# Patient Record
Sex: Female | Born: 1970 | Race: White | Hispanic: No | Smoking: Never smoker
Health system: Southern US, Community
[De-identification: ages and names within clinical notes are randomized; demographics above are authoritative.]

## PROBLEM LIST (undated history)

## (undated) DIAGNOSIS — Z87442 Personal history of urinary calculi: Secondary | ICD-10-CM

## (undated) DIAGNOSIS — Z9889 Other specified postprocedural states: Secondary | ICD-10-CM

## (undated) DIAGNOSIS — Z8489 Family history of other specified conditions: Secondary | ICD-10-CM

## (undated) DIAGNOSIS — M169 Osteoarthritis of hip, unspecified: Secondary | ICD-10-CM

## (undated) DIAGNOSIS — Z85828 Personal history of other malignant neoplasm of skin: Secondary | ICD-10-CM

## (undated) DIAGNOSIS — B019 Varicella without complication: Secondary | ICD-10-CM

## (undated) DIAGNOSIS — M25552 Pain in left hip: Secondary | ICD-10-CM

## (undated) DIAGNOSIS — R001 Bradycardia, unspecified: Secondary | ICD-10-CM

## (undated) DIAGNOSIS — E785 Hyperlipidemia, unspecified: Secondary | ICD-10-CM

## (undated) DIAGNOSIS — Q2112 Patent foramen ovale: Secondary | ICD-10-CM

## (undated) DIAGNOSIS — R030 Elevated blood-pressure reading, without diagnosis of hypertension: Secondary | ICD-10-CM

## (undated) DIAGNOSIS — G4700 Insomnia, unspecified: Secondary | ICD-10-CM

## (undated) DIAGNOSIS — Q211 Atrial septal defect: Secondary | ICD-10-CM

## (undated) HISTORY — DX: Pain in left hip: M25.552

## (undated) HISTORY — DX: Atrial septal defect: Q21.1

## (undated) HISTORY — DX: Hyperlipidemia, unspecified: E78.5

## (undated) HISTORY — DX: Other specified postprocedural states: Z98.890

## (undated) HISTORY — DX: Varicella without complication: B01.9

## (undated) HISTORY — DX: Bradycardia, unspecified: R00.1

## (undated) HISTORY — DX: Insomnia, unspecified: G47.00

## (undated) HISTORY — DX: Osteoarthritis of hip, unspecified: M16.9

## (undated) HISTORY — DX: Patent foramen ovale: Q21.12

---

## 1995-08-09 HISTORY — PX: OTHER SURGICAL HISTORY: SHX169

## 1998-04-21 ENCOUNTER — Ambulatory Visit (HOSPITAL_COMMUNITY): Admission: RE | Admit: 1998-04-21 | Discharge: 1998-04-21 | Payer: Self-pay | Admitting: Obstetrics & Gynecology

## 1998-05-15 ENCOUNTER — Inpatient Hospital Stay (HOSPITAL_COMMUNITY): Admission: AD | Admit: 1998-05-15 | Discharge: 1998-05-15 | Payer: Self-pay | Admitting: *Deleted

## 1998-06-29 ENCOUNTER — Inpatient Hospital Stay (HOSPITAL_COMMUNITY): Admission: AD | Admit: 1998-06-29 | Discharge: 1998-06-29 | Payer: Self-pay | Admitting: Obstetrics & Gynecology

## 1998-06-29 ENCOUNTER — Encounter: Payer: Self-pay | Admitting: Obstetrics & Gynecology

## 1998-07-17 ENCOUNTER — Inpatient Hospital Stay (HOSPITAL_COMMUNITY): Admission: RE | Admit: 1998-07-17 | Discharge: 1998-07-17 | Payer: Self-pay | Admitting: *Deleted

## 1998-07-20 ENCOUNTER — Inpatient Hospital Stay (HOSPITAL_COMMUNITY): Admission: AD | Admit: 1998-07-20 | Discharge: 1998-07-22 | Payer: Self-pay | Admitting: Obstetrics & Gynecology

## 1998-07-29 ENCOUNTER — Observation Stay (HOSPITAL_COMMUNITY): Admission: AD | Admit: 1998-07-29 | Discharge: 1998-07-30 | Payer: Self-pay | Admitting: Obstetrics

## 1998-07-29 ENCOUNTER — Encounter (HOSPITAL_COMMUNITY): Admission: RE | Admit: 1998-07-29 | Discharge: 1998-09-03 | Payer: Self-pay | Admitting: Obstetrics & Gynecology

## 1998-07-29 ENCOUNTER — Encounter: Admission: RE | Admit: 1998-07-29 | Discharge: 1998-07-29 | Payer: Self-pay | Admitting: Obstetrics & Gynecology

## 1998-08-02 ENCOUNTER — Inpatient Hospital Stay (HOSPITAL_COMMUNITY): Admission: AD | Admit: 1998-08-02 | Discharge: 1998-08-04 | Payer: Self-pay | Admitting: Obstetrics

## 1998-08-05 ENCOUNTER — Encounter (HOSPITAL_COMMUNITY): Admission: RE | Admit: 1998-08-05 | Discharge: 1998-09-02 | Payer: Self-pay | Admitting: Obstetrics

## 1998-08-05 ENCOUNTER — Encounter: Admission: RE | Admit: 1998-08-05 | Discharge: 1998-08-05 | Payer: Self-pay | Admitting: Obstetrics & Gynecology

## 1998-08-13 ENCOUNTER — Encounter: Admission: RE | Admit: 1998-08-13 | Discharge: 1998-08-13 | Payer: Self-pay | Admitting: Obstetrics

## 1998-08-20 ENCOUNTER — Encounter: Admission: RE | Admit: 1998-08-20 | Discharge: 1998-08-20 | Payer: Self-pay | Admitting: Obstetrics

## 1998-08-31 ENCOUNTER — Inpatient Hospital Stay (HOSPITAL_COMMUNITY): Admission: AD | Admit: 1998-08-31 | Discharge: 1998-09-05 | Payer: Self-pay | Admitting: Obstetrics

## 1998-09-03 ENCOUNTER — Encounter: Payer: Self-pay | Admitting: Obstetrics

## 1998-09-09 ENCOUNTER — Inpatient Hospital Stay (HOSPITAL_COMMUNITY): Admission: AD | Admit: 1998-09-09 | Discharge: 1998-09-09 | Payer: Self-pay | Admitting: *Deleted

## 1998-09-09 ENCOUNTER — Encounter (HOSPITAL_COMMUNITY): Admission: RE | Admit: 1998-09-09 | Discharge: 1998-12-08 | Payer: Self-pay | Admitting: *Deleted

## 1999-05-24 ENCOUNTER — Emergency Department (HOSPITAL_COMMUNITY): Admission: EM | Admit: 1999-05-24 | Discharge: 1999-05-24 | Payer: Self-pay | Admitting: Internal Medicine

## 1999-07-06 ENCOUNTER — Emergency Department (HOSPITAL_COMMUNITY): Admission: EM | Admit: 1999-07-06 | Discharge: 1999-07-06 | Payer: Self-pay | Admitting: Emergency Medicine

## 1999-10-01 ENCOUNTER — Other Ambulatory Visit: Admission: RE | Admit: 1999-10-01 | Discharge: 1999-10-01 | Payer: Self-pay | Admitting: Obstetrics and Gynecology

## 2000-02-20 ENCOUNTER — Encounter: Payer: Self-pay | Admitting: Emergency Medicine

## 2000-02-20 ENCOUNTER — Emergency Department (HOSPITAL_COMMUNITY): Admission: EM | Admit: 2000-02-20 | Discharge: 2000-02-20 | Payer: Self-pay | Admitting: Emergency Medicine

## 2000-09-22 ENCOUNTER — Other Ambulatory Visit: Admission: RE | Admit: 2000-09-22 | Discharge: 2000-09-22 | Payer: Self-pay | Admitting: Obstetrics and Gynecology

## 2001-09-25 ENCOUNTER — Other Ambulatory Visit: Admission: RE | Admit: 2001-09-25 | Discharge: 2001-09-25 | Payer: Self-pay | Admitting: *Deleted

## 2001-10-06 HISTORY — PX: OTHER SURGICAL HISTORY: SHX169

## 2002-09-26 ENCOUNTER — Other Ambulatory Visit: Admission: RE | Admit: 2002-09-26 | Discharge: 2002-09-26 | Payer: Self-pay | Admitting: Obstetrics and Gynecology

## 2002-11-01 ENCOUNTER — Ambulatory Visit (HOSPITAL_COMMUNITY): Admission: RE | Admit: 2002-11-01 | Discharge: 2002-11-01 | Payer: Self-pay | Admitting: Obstetrics and Gynecology

## 2003-01-24 ENCOUNTER — Encounter: Payer: Self-pay | Admitting: Obstetrics and Gynecology

## 2003-01-24 ENCOUNTER — Encounter: Admission: RE | Admit: 2003-01-24 | Discharge: 2003-01-24 | Payer: Self-pay | Admitting: Obstetrics and Gynecology

## 2003-06-28 ENCOUNTER — Emergency Department (HOSPITAL_COMMUNITY): Admission: EM | Admit: 2003-06-28 | Discharge: 2003-06-28 | Payer: Self-pay | Admitting: Emergency Medicine

## 2003-08-09 HISTORY — PX: BUNIONECTOMY: SHX129

## 2005-07-03 ENCOUNTER — Emergency Department (HOSPITAL_COMMUNITY): Admission: EM | Admit: 2005-07-03 | Discharge: 2005-07-03 | Payer: Self-pay | Admitting: Emergency Medicine

## 2005-08-17 ENCOUNTER — Emergency Department (HOSPITAL_COMMUNITY): Admission: EM | Admit: 2005-08-17 | Discharge: 2005-08-18 | Payer: Self-pay | Admitting: Emergency Medicine

## 2005-08-23 ENCOUNTER — Ambulatory Visit: Payer: Self-pay | Admitting: Internal Medicine

## 2005-08-24 ENCOUNTER — Encounter (INDEPENDENT_AMBULATORY_CARE_PROVIDER_SITE_OTHER): Payer: Self-pay | Admitting: Specialist

## 2005-08-24 ENCOUNTER — Ambulatory Visit: Payer: Self-pay | Admitting: Internal Medicine

## 2005-08-31 ENCOUNTER — Ambulatory Visit (HOSPITAL_COMMUNITY): Admission: RE | Admit: 2005-08-31 | Discharge: 2005-08-31 | Payer: Self-pay | Admitting: Internal Medicine

## 2006-02-03 ENCOUNTER — Ambulatory Visit (HOSPITAL_COMMUNITY): Admission: RE | Admit: 2006-02-03 | Discharge: 2006-02-03 | Payer: Self-pay | Admitting: Obstetrics & Gynecology

## 2006-09-08 ENCOUNTER — Emergency Department (HOSPITAL_COMMUNITY): Admission: EM | Admit: 2006-09-08 | Discharge: 2006-09-08 | Payer: Self-pay | Admitting: Emergency Medicine

## 2009-02-02 ENCOUNTER — Emergency Department (HOSPITAL_BASED_OUTPATIENT_CLINIC_OR_DEPARTMENT_OTHER): Admission: EM | Admit: 2009-02-02 | Discharge: 2009-02-03 | Payer: Self-pay | Admitting: Emergency Medicine

## 2010-06-08 HISTORY — PX: ENDOMETRIAL ABLATION: SHX621

## 2010-06-08 LAB — HM PAP SMEAR

## 2010-06-25 ENCOUNTER — Ambulatory Visit (HOSPITAL_COMMUNITY): Admission: RE | Admit: 2010-06-25 | Discharge: 2010-06-25 | Payer: Self-pay | Admitting: Obstetrics

## 2010-10-19 LAB — CBC
MCHC: 33.8 g/dL (ref 30.0–36.0)
MCV: 86.7 fL (ref 78.0–100.0)
Platelets: 410 10*3/uL — ABNORMAL HIGH (ref 150–400)
RDW: 14.2 % (ref 11.5–15.5)
WBC: 8.3 10*3/uL (ref 4.0–10.5)

## 2010-12-24 NOTE — Op Note (Signed)
NAME:  Hannah Davis, Hannah Davis                          ACCOUNT NO.:  [id number]   MEDICAL RECORD NO.:  [id number]                   PATIENT TYPE:  AMB   LOCATION:  SDC                                  FACILITY:  [facility]   PHYSICIAN:  Cynthia P. Romine, M.D.             DATE OF BIRTH:  [birth year]   DATE OF PROCEDURE:  11/01/2002  DATE OF DISCHARGE:                                 OPERATIVE REPORT   PREOPERATIVE DIAGNOSIS:  Desire for attempt at permanent sterilization.   POSTOPERATIVE DIAGNOSIS:  Desire for attempt at permanent sterilization   PROCEDURE:  Falope ring laparoscopic tubal sterilization.   SURGEON:  Cynthia P. Romine, M.D.   ANESTHESIA:  General endotracheal.   ESTIMATED BLOOD LOSS:  Minimal.   COMPLICATIONS:  None.   DESCRIPTION OF PROCEDURE:  The patient was taken to the operating room and  after induction of adequate general anesthesia was placed in the dorsal  lithotomy position and prepped and draped in the usual fashion.  The bladder  was drained in and out catheter.  The cervix was visualized and grasped on  the anterior lip with Davis single-tooth tenaculum.  An acorn uterine  manipulator was placed.  Davis small umbilical incision was made, and the Veress  needle was inserted into the peritoneal space.  Proper placement of the  Veress needle was tested by noting free flow of the Veress needle with  negative aspirate and then by noting the response with Davis drop of saline  placed at the hub of the Veress needle with negative pressure as the  abdominal wall was elevated.  The pneumoperitoneum was created with 2.5  liters of CO2 using the automatic insufflator.  Davis disposable 10-11 mm trocar  was then introduced into the peritoneal space and its proper placement noted  with the laparoscope.  Next, Davis small suprapubic incision was made, and the  lower trocar was inserted under direct visualization.  The left fallopian  tube was identified and traced to its fimbriated end.  The mid  isthmic  portion was elevated, and the Falope ring was placed without difficulty.  The knuckle of tube was noted to be contained within the ring, and good  blanching was noted.  There was no bleeding.  The procedure was identified  on the patient's right, identifying the tube and tracing it to its  fimbriated end.  The mid isthmic portion was elevated, and Falope ring was  placed.  Davis good knuckle of tube was noted to be contained within the ring.  Good blanching was noted, and no bleeding was encountered.  The remainder of  the pelvis appeared normal.  The uterus was of Davis normal size, shape and  contour.  The anterior and posterior cul-de-sacs were normal.  The ovaries  and tubes appeared normal.  The laparoscope and the Falope ring applier were  then removed, and the pneumoperitoneum was allowed to escape.  The  laparoscopic sleeves were removed.  The incisions were closed subcuticularly  with 3-0 Vicryl.  Instruments were removed from the vagina, and the  procedure was terminated.  The patient tolerated it well and went in  satisfactory condition to post anesthesia recovery.                                               Cynthia P. Romine, M.D.    CPR/MEDQ  D:  11/01/2002  T:  11/02/2002  Job:  [id number]

## 2011-06-01 ENCOUNTER — Other Ambulatory Visit: Payer: Self-pay | Admitting: Family Medicine

## 2011-06-01 ENCOUNTER — Encounter: Payer: Self-pay | Admitting: Family Medicine

## 2011-06-01 ENCOUNTER — Ambulatory Visit (INDEPENDENT_AMBULATORY_CARE_PROVIDER_SITE_OTHER): Payer: BC Managed Care – PPO | Admitting: Family Medicine

## 2011-06-01 DIAGNOSIS — Z9889 Other specified postprocedural states: Secondary | ICD-10-CM | POA: Insufficient documentation

## 2011-06-01 DIAGNOSIS — Q211 Atrial septal defect: Secondary | ICD-10-CM | POA: Insufficient documentation

## 2011-06-01 DIAGNOSIS — Z8742 Personal history of other diseases of the female genital tract: Secondary | ICD-10-CM

## 2011-06-01 DIAGNOSIS — R609 Edema, unspecified: Secondary | ICD-10-CM

## 2011-06-01 DIAGNOSIS — G4700 Insomnia, unspecified: Secondary | ICD-10-CM

## 2011-06-01 DIAGNOSIS — Z0000 Encounter for general adult medical examination without abnormal findings: Secondary | ICD-10-CM

## 2011-06-01 DIAGNOSIS — E669 Obesity, unspecified: Secondary | ICD-10-CM

## 2011-06-01 DIAGNOSIS — Z8669 Personal history of other diseases of the nervous system and sense organs: Secondary | ICD-10-CM

## 2011-06-01 DIAGNOSIS — B019 Varicella without complication: Secondary | ICD-10-CM | POA: Insufficient documentation

## 2011-06-01 DIAGNOSIS — E785 Hyperlipidemia, unspecified: Secondary | ICD-10-CM

## 2011-06-01 DIAGNOSIS — E079 Disorder of thyroid, unspecified: Secondary | ICD-10-CM | POA: Insufficient documentation

## 2011-06-01 DIAGNOSIS — M25552 Pain in left hip: Secondary | ICD-10-CM

## 2011-06-01 MED ORDER — FUROSEMIDE 20 MG PO TABS: 20.0000 mg | ORAL_TABLET | Freq: Every day | ORAL | Status: DC

## 2011-06-01 NOTE — Patient Instructions (Signed)

## 2011-06-02 LAB — BASIC METABOLIC PANEL
BUN: 12 mg/dL (ref 6–23)
CO2: 28 mEq/L (ref 19–32)
Creat: 0.83 mg/dL (ref 0.50–1.10)
Sodium: 139 mEq/L (ref 135–145)

## 2011-06-02 LAB — LIPID PANEL
Cholesterol: 194 mg/dL (ref 0–200)
LDL Cholesterol: 122 mg/dL — ABNORMAL HIGH (ref 0–99)
Total CHOL/HDL Ratio: 4.7 Ratio
Triglycerides: 154 mg/dL — ABNORMAL HIGH (ref <150)

## 2011-06-02 LAB — CBC
HCT: 41.6 % (ref 36.0–46.0)
MCH: 29.4 pg (ref 26.0–34.0)
MCHC: 32.7 g/dL (ref 30.0–36.0)
Platelets: 397 10*3/uL (ref 150–400)
RBC: 4.62 MIL/uL (ref 3.87–5.11)
RDW: 13.6 % (ref 11.5–15.5)

## 2011-06-02 LAB — HEPATIC FUNCTION PANEL
Albumin: 3.7 g/dL (ref 3.5–5.2)
Bilirubin, Direct: 0.1 mg/dL (ref 0.0–0.3)
Indirect Bilirubin: 0.2 mg/dL (ref 0.0–0.9)
Total Protein: 6.9 g/dL (ref 6.0–8.3)

## 2011-06-02 LAB — PHOSPHORUS: Phosphorus: 3.8 mg/dL (ref 2.3–4.6)

## 2011-06-05 ENCOUNTER — Encounter: Payer: Self-pay | Admitting: Family Medicine

## 2011-06-05 DIAGNOSIS — M25552 Pain in left hip: Secondary | ICD-10-CM

## 2011-06-05 DIAGNOSIS — Z0000 Encounter for general adult medical examination without abnormal findings: Secondary | ICD-10-CM | POA: Insufficient documentation

## 2011-06-05 DIAGNOSIS — E785 Hyperlipidemia, unspecified: Secondary | ICD-10-CM

## 2011-06-05 DIAGNOSIS — IMO0001 Reserved for inherently not codable concepts without codable children: Secondary | ICD-10-CM | POA: Insufficient documentation

## 2011-06-05 DIAGNOSIS — Z8742 Personal history of other diseases of the female genital tract: Secondary | ICD-10-CM | POA: Insufficient documentation

## 2011-06-05 DIAGNOSIS — G4700 Insomnia, unspecified: Secondary | ICD-10-CM

## 2011-06-05 HISTORY — DX: Insomnia, unspecified: G47.00

## 2011-06-05 HISTORY — DX: Hyperlipidemia, unspecified: E78.5

## 2011-06-05 HISTORY — DX: Pain in left hip: M25.552

## 2011-06-05 NOTE — Assessment & Plan Note (Signed)
Mild elevation of LDL cholesterol, avoid trans fats, add a fish oil caps and fiber supplement

## 2011-06-05 NOTE — Assessment & Plan Note (Addendum)
Falls asleep readily but wakes after a couple hours and has trouble falling back to sleep. She denies any snoring or am HA. Discussed good sleep hygiene. May consider use of Melatonin and/or Benadryl

## 2011-06-05 NOTE — Assessment & Plan Note (Signed)
Discussed need for heart healthy diet, increased exercise, 8 hours of sleep and seat belt use

## 2011-06-05 NOTE — Assessment & Plan Note (Signed)
No recent flares 

## 2011-06-05 NOTE — Assessment & Plan Note (Addendum)
Asymptomatic, encouraged DASH diet and increased exercise, recheck bp at the next visit

## 2011-06-05 NOTE — Assessment & Plan Note (Signed)
Encouraged increased  Exercise and decreased po intake avoid trans fats and try DASH diet

## 2011-06-05 NOTE — Progress Notes (Signed)
Hannah Davis [id number] 1971-04-15 06/05/2011      Progress Note-Follow Up  Subjective  Chief Complaint  Chief Complaint  Patient presents with  . Establish Care    new patient/ wellness visit for [employer]    HPI  Patient is a 40 year old female in today for new patient appointment. Her major ongoing complaint is weight gain. She reports over the last 5 years she has gained 145 pounds. She has seen pediatric clinics and had multiple medications and diet. 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 She walks and exercises regularly. She tries to minimize her by mouth intake and avoids red meat. She finds herself very frustrated. No recent febrile illness, fevers, chills, chest pain, palpitations, shortness of breath, GI or GU concerns. She has a history of migraines but has not had any recent travel. She does have her with sleep. She falls asleep but wakes after one to 2 hours and has trouble falling back to sleep. She denies snoring or a.m. headaches. Her major complaint today is left hip pain she describes as sharp and intermittent. No radicular symptoms or incontinence is noted. She underwent endometrial ablation in November 2011 secondary to menorrhagia has done well since that time. Had a tetanus shot and a Pap at that time. Declines flu shots.  Past Medical History  Diagnosis Date  . Chicken pox as a child  . Obesity   . Thyroid disorder   . PFO (patent foramen ovale)   . Hx of migraines   . Elevated BP 06/05/2011  . Hyperlipidemia 06/05/2011    Past Surgical History  Procedure Date  . Endometrial ablation 11-11  . Tubes tied 3-03  . Arthroscopy on right wrist 1997  . Bunionectomy 2005    left foot big toe  . Cesarean section 1-00    Family History  Problem Relation Age of Onset  . Cancer Mother 34    kidney/ removed kidney in remission  . Fibromyalgia Mother   . Asthma Mother   . Stroke Mother   . Heart attack Mother   . Arthritis Mother     rheumatoid  . Diabetes Father       type 2  . Heart disease Father   . Other Father     open heart bypass surgery/ smoked  . Other Sister     kidney problems  . Heart disease Brother   . Other Brother     open heart surgery  . Drug abuse Brother     cocaine  . Other Maternal Grandmother     brain tumor  . Heart attack Maternal Grandfather   . Heart disease Maternal Grandfather     MI  . Stroke Paternal Grandmother 41  . Other Paternal Grandfather     enlarged heart  . Obesity Paternal Grandfather   . Stroke Brother     X 2    History   Social History  . Marital Status: Married    Spouse Name: N/A    Number of Children: N/A  . Years of Education: N/A   Occupational History  . Not on file.   Social History Main Topics  . Smoking status: Never Smoker   . Smokeless tobacco: Never Used  . Alcohol Use: Yes     a beer on the weekend  . Drug Use: No  . Sexually Active: Yes -- Female partner(s)   Other Topics Concern  . Not on file   Social History Narrative  . No narrative on file  No current outpatient prescriptions on file prior to visit.    Allergies  Allergen Reactions  . Codeine Hives  . Ibuprofen Nausea And Vomiting    Review of Systems  Review of Systems  Constitutional: Negative for fever, chills, weight loss and malaise/fatigue.       Weight gain of 145# in 5 years, despite walking and trying to minimize calories and avoiding red meat  HENT: Negative for hearing loss, nosebleeds and congestion.   Eyes: Negative for discharge.  Respiratory: Negative for cough, sputum production, shortness of breath and wheezing.   Cardiovascular: Negative for chest pain, palpitations and leg swelling.  Gastrointestinal: Negative for heartburn, nausea, vomiting, abdominal pain, diarrhea, constipation and blood in stool.  Genitourinary: Negative for dysuria, urgency, frequency and hematuria.  Musculoskeletal: Positive for joint pain. Negative for myalgias, back pain and falls.       Hip pain,  left  Skin: Negative for rash.  Neurological: Positive for headaches. Negative for dizziness, tremors, sensory change, focal weakness, loss of consciousness and weakness.  Endo/Heme/Allergies: Negative for polydipsia. Does not bruise/bleed easily.  Psychiatric/Behavioral: Negative for depression and suicidal ideas. The patient has insomnia. The patient is not nervous/anxious.     Objective  BP 134/95  Pulse 74  Temp(Src) 97.9 F (36.6 C) (Oral)  Ht 5' 5.5" (1.664 m)  Wt 300 lb 6.4 oz (136.261 kg)  BMI 49.23 kg/m2  SpO2 99%  Physical Exam  Physical Exam  Constitutional: She is oriented to person, place, and time and well-developed, well-nourished, and in no distress. No distress.       obese  HENT:  Head: Normocephalic and atraumatic.  Right Ear: External ear normal.  Left Ear: External ear normal.  Nose: Nose normal.  Mouth/Throat: Oropharynx is clear and moist. No oropharyngeal exudate.  Eyes: Conjunctivae are normal. Pupils are equal, round, and reactive to light. Right eye exhibits no discharge. Left eye exhibits no discharge. No scleral icterus.  Neck: Normal range of motion. Neck supple. No thyromegaly present.  Cardiovascular: Normal rate, regular rhythm, normal heart sounds and intact distal pulses.   No murmur heard. Pulmonary/Chest: Effort normal and breath sounds normal. No respiratory distress. She has no wheezes. She has no rales.  Abdominal: Soft. Bowel sounds are normal. She exhibits no distension and no mass. There is no tenderness.  Musculoskeletal: Normal range of motion. She exhibits no edema and no tenderness.  Lymphadenopathy:    She has no cervical adenopathy.  Neurological: She is alert and oriented to person, place, and time. She has normal reflexes. No cranial nerve deficit. Coordination normal.  Skin: Skin is warm and dry. No rash noted. She is not diaphoretic. No erythema.  Psychiatric: Mood, memory, affect and judgment normal.    Lab Results   Component Value Date   TSH 3.231 06/01/2011   Lab Results  Component Value Date   WBC 7.6 06/01/2011   HGB 13.6 06/01/2011   HCT 41.6 06/01/2011   MCV 90.0 06/01/2011   PLT 397 06/01/2011   Lab Results  Component Value Date   CREATININE 0.83 06/01/2011   BUN 12 06/01/2011   NA 139 06/01/2011   K 4.2 06/01/2011   CL 106 06/01/2011   CO2 28 06/01/2011   Lab Results  Component Value Date   ALT 10 06/01/2011   AST 19 06/01/2011   ALKPHOS 79 06/01/2011   BILITOT 0.3 06/01/2011   Lab Results  Component Value Date   CHOL 194 06/01/2011   Lab Results  Component Value Date   HDL 41 06/01/2011   Lab Results  Component Value Date   LDLCALC 122* 06/01/2011   Lab Results  Component Value Date   TRIG 154* 06/01/2011   Lab Results  Component Value Date   CHOLHDL 4.7 06/01/2011     Assessment & Plan   Elevated BP Asymptomatic, encouraged DASH diet and increased exercise, recheck bp at the next visit  Obesity Encouraged increased  Exercise and decreased po intake avoid trans fats and try DASH diet  Thyroid disorder Thyroid numbers well wnl today will monitor with patient  Hyperlipidemia Mild elevation of LDL cholesterol, avoid trans fats, add a fish oil caps and fiber supplement  Hx of migraines No recent flares  Insomnia Falls asleep readily but wakes after a couple hours and has trouble falling back to sleep. She denies any snoring or am HA. Discussed good sleep hygiene. May consider use of Melatonin and/or Benadryl  Preventative health care Discussed need for heart healthy diet, increased exercise, 8 hours of sleep and seat belt use

## 2011-06-05 NOTE — Assessment & Plan Note (Signed)
Thyroid numbers well wnl today will monitor with patient

## 2012-05-08 HISTORY — PX: LAPAROSCOPIC GASTRIC SLEEVE RESECTION: SHX5895

## 2012-05-16 ENCOUNTER — Encounter: Payer: Self-pay | Admitting: Family Medicine

## 2012-05-16 ENCOUNTER — Ambulatory Visit (INDEPENDENT_AMBULATORY_CARE_PROVIDER_SITE_OTHER): Payer: PRIVATE HEALTH INSURANCE | Admitting: Family Medicine

## 2012-05-16 VITALS — BP 135/97 | HR 97 | Temp 97.2°F | Ht 65.5 in | Wt 299.4 lb

## 2012-05-16 DIAGNOSIS — R030 Elevated blood-pressure reading, without diagnosis of hypertension: Secondary | ICD-10-CM

## 2012-05-16 DIAGNOSIS — E669 Obesity, unspecified: Secondary | ICD-10-CM

## 2012-05-16 DIAGNOSIS — E079 Disorder of thyroid, unspecified: Secondary | ICD-10-CM

## 2012-05-16 DIAGNOSIS — Z0000 Encounter for general adult medical examination without abnormal findings: Secondary | ICD-10-CM

## 2012-05-16 DIAGNOSIS — Z8669 Personal history of other diseases of the nervous system and sense organs: Secondary | ICD-10-CM

## 2012-05-16 DIAGNOSIS — E785 Hyperlipidemia, unspecified: Secondary | ICD-10-CM

## 2012-05-16 LAB — LIPID PANEL
LDL Cholesterol: 128 mg/dL — ABNORMAL HIGH (ref 0–99)
Total CHOL/HDL Ratio: 4

## 2012-05-16 LAB — CBC
MCHC: 32.9 g/dL (ref 30.0–36.0)
Platelets: 323 10*3/uL (ref 150.0–400.0)
WBC: 6.7 10*3/uL (ref 4.5–10.5)

## 2012-05-16 LAB — HEPATIC FUNCTION PANEL
AST: 18 U/L (ref 0–37)
Alkaline Phosphatase: 64 U/L (ref 39–117)
Bilirubin, Direct: 0 mg/dL (ref 0.0–0.3)
Total Protein: 7.1 g/dL (ref 6.0–8.3)

## 2012-05-16 LAB — RENAL FUNCTION PANEL
Albumin: 3.4 g/dL — ABNORMAL LOW (ref 3.5–5.2)
BUN: 11 mg/dL (ref 6–23)
Calcium: 8.6 mg/dL (ref 8.4–10.5)
Creatinine, Ser: 0.8 mg/dL (ref 0.4–1.2)
GFR: 91.41 mL/min (ref >60.00)

## 2012-05-16 LAB — TSH: TSH: 2.91 u[IU]/mL (ref 0.35–5.50)

## 2012-05-16 NOTE — Patient Instructions (Addendum)
Calorie Counting Diet A calorie counting diet requires you to eat the number of calories that are right for you in a day. Calories are the measurement of how much energy you get from the food you eat. Eating the right amount of calories is important for staying at a healthy weight. If you eat too many calories, your body will store them as fat and you may gain weight. If you eat too few calories, you may lose weight. Counting the number of calories you eat during a day will help you know if you are eating the right amount. A Registered Dietitian can determine how many calories you need in a day. The amount of calories needed varies from person to person. If your goal is to lose weight, you will need to eat fewer calories. Losing weight can benefit you if you are overweight or have health problems such as heart disease, high blood pressure, or diabetes. If your goal is to gain weight, you will need to eat more calories. Gaining weight may be necessary if you have a certain health problem that causes your body to need more energy. TIPS Whether you are increasing or decreasing the number of calories you eat during a day, it may be hard to get used to changes in what you eat and drink. The following are tips to help you keep track of the number of calories you eat.  Measure foods at home with measuring cups. This helps you know the amount of food and number of calories you are eating.  Restaurants often serve food in amounts that are larger than 1 serving. While eating out, estimate how many servings of a food you are given. For example, a serving of cooked rice is  cup or about the size of half of a fist. Knowing serving sizes will help you be aware of how much food you are eating at restaurants.  Ask for smaller portion sizes or child-size portions at restaurants.  Plan to eat half of a meal at a restaurant. Take the rest home or share the other half with a friend.  Read the Nutrition Facts panel on  food labels for calorie content and serving size. You can find out how many servings are in a package, the size of a serving, and the number of calories each serving has.  For example, a package might contain 3 cookies. The Nutrition Facts panel on that package says that 1 serving is 1 cookie. Below that, it will say there are 3 servings in the container. The calories section of the Nutrition Facts label says there are 90 calories. This means there are 90 calories in 1 cookie (1 serving). If you eat 1 cookie you have eaten 90 calories. If you eat all 3 cookies, you have eaten 270 calories (3 servings x 90 calories = 270 calories). The list below tells you how big or small some common portion sizes are.  1 oz.........4 stacked dice.  3 oz.........Deck of cards.  1 tsp........Tip of little finger.  1 tbs........Thumb.  2 tbs........Golf ball.   cup.......Half of a fist.  1 cup........A fist. KEEP A FOOD LOG Write down every food item you eat, the amount you eat, and the number of calories in each food you eat during the day. At the end of the day, you can add up the total number of calories you have eaten. It may help to keep a list like the one below. Find out the calorie information by reading the   Nutrition Facts panel on food labels. Breakfast  Bran cereal (1 cup, 110 calories).  Fat-free milk ( cup, 45 calories). Snack  Apple (1 medium, 80 calories). Lunch  Spinach (1 cup, 20 calories).  Tomato ( medium, 20 calories).  Chicken breast strips (3 oz, 165 calories).  Shredded cheddar cheese ( cup, 110 calories).  Light Italian dressing (2 tbs, 60 calories).  Whole-wheat bread (1 slice, 80 calories).  Tub margarine (1 tsp, 35 calories).  Vegetable soup (1 cup, 160 calories). Dinner  Pork chop (3 oz, 190 calories).  Brown rice (1 cup, 215 calories).  Steamed broccoli ( cup, 20 calories).  Strawberries (1  cup, 65 calories).  Whipped cream (1 tbs, 50  calories). Daily Calorie Total: 1425 Document Released: 07/25/2005 Document Revised: 10/17/2011 Document Reviewed: 01/19/2007 [employer] Patient Information 2013 [employer].  

## 2012-05-18 ENCOUNTER — Encounter: Payer: Self-pay | Admitting: Family Medicine

## 2012-05-18 NOTE — Assessment & Plan Note (Signed)
Declines flu shot, encouraged to consider, dash diet, seat belts. Encouraged MGM at 41 yo

## 2012-05-18 NOTE — Progress Notes (Signed)
Patient ID: Hannah Davis, female   DOB: [birth year], 41 y.o.   MRN: [id number] Hannah Davis [id number] 09-11-1970 05/18/2012      Progress Note New Patient  Subjective  Chief Complaint  Chief Complaint  Patient presents with  . Annual Exam    physical    HPI  Patient is a 41 year old Caucasian female who is in today for annual exam. Overall she feels well. She's not had any significant recent illness, fevers, chills, chest pain, palpitations, shortness of breath, GI or GU complaints. No recent headaches or difficulty with insomnia. No changes to medications or trips in the emergency room this.  Past Medical History  Diagnosis Date  . Chicken pox as a child  . Obesity   . Thyroid disorder   . PFO (patent foramen ovale)   . Hx of migraines   . Elevated BP 06/05/2011  . Hyperlipidemia 06/05/2011  . Hip pain, left 06/05/2011  . Insomnia 06/05/2011  . Preventative health care 06/05/2011    Past Surgical History  Procedure Date  . Endometrial ablation 11-11  . Tubes tied 3-03  . Arthroscopy on right wrist 1997  . Bunionectomy 2005    left foot big toe  . Cesarean section 1-00    Family History  Problem Relation Age of Onset  . Cancer Mother 43    kidney/ removed kidney in remission  . Fibromyalgia Mother   . Asthma Mother   . Stroke Mother   . Heart attack Mother   . Arthritis Mother     rheumatoid  . Diabetes Father     type 2  . Heart disease Father   . Other Father     open heart bypass surgery/ smoked  . Other Sister     kidney problems  . Heart disease Brother   . Other Brother     open heart surgery  . Drug abuse Brother     cocaine  . Other Maternal Grandmother     brain tumor  . Heart attack Maternal Grandfather   . Heart disease Maternal Grandfather     MI  . Stroke Paternal Grandmother 72  . Other Paternal Grandfather     enlarged heart  . Obesity Paternal Grandfather   . Stroke Brother     X 2    History   Social History    . Marital Status: Married    Spouse Name: N/A    Number of Children: N/A  . Years of Education: N/A   Occupational History  . Not on file.   Social History Main Topics  . Smoking status: Never Smoker   . Smokeless tobacco: Never Used  . Alcohol Use: Yes     a beer on the weekend  . Drug Use: No  . Sexually Active: Yes -- Female partner(s)   Other Topics Concern  . Not on file   Social History Narrative  . No narrative on file    No current outpatient prescriptions on file prior to visit.    Allergies  Allergen Reactions  . Codeine Hives  . Ibuprofen Nausea And Vomiting    Review of Systems  Review of Systems  Constitutional: Negative for fever, chills and malaise/fatigue.  HENT: Negative for hearing loss, nosebleeds and congestion.   Eyes: Negative for discharge.  Respiratory: Negative for cough, sputum production, shortness of breath and wheezing.   Cardiovascular: Negative for chest pain, palpitations and leg swelling.  Gastrointestinal: Negative for heartburn, nausea, vomiting, abdominal pain, diarrhea,  constipation and blood in stool.  Genitourinary: Negative for dysuria, urgency, frequency and hematuria.  Musculoskeletal: Negative for myalgias, back pain and falls.  Skin: Negative for rash.  Neurological: Negative for dizziness, tremors, sensory change, focal weakness, loss of consciousness, weakness and headaches.  Endo/Heme/Allergies: Negative for polydipsia. Does not bruise/bleed easily.  Psychiatric/Behavioral: Negative for depression and suicidal ideas. The patient is not nervous/anxious and does not have insomnia.     Objective  BP 135/97  Pulse 97  Temp 97.2 F (36.2 C) (Temporal)  Ht 5' 5.5" (1.664 m)  Wt 299 lb 6.4 oz (135.807 kg)  BMI 49.07 kg/m2  SpO2 96%  Physical Exam  Physical Exam  Constitutional: She is oriented to person, place, and time and well-developed, well-nourished, and in no distress. No distress.       obese  HENT:   Head: Normocephalic and atraumatic.  Right Ear: External ear normal.  Left Ear: External ear normal.  Nose: Nose normal.  Mouth/Throat: Oropharynx is clear and moist. No oropharyngeal exudate.  Eyes: Conjunctivae normal are normal. Pupils are equal, round, and reactive to light. Right eye exhibits no discharge. Left eye exhibits no discharge. No scleral icterus.  Neck: Normal range of motion. Neck supple. No thyromegaly present.  Cardiovascular: Normal rate, regular rhythm, normal heart sounds and intact distal pulses.   No murmur heard. Pulmonary/Chest: Effort normal and breath sounds normal. No respiratory distress. She has no wheezes. She has no rales.  Abdominal: Soft. Bowel sounds are normal. She exhibits no distension and no mass. There is no tenderness.  Musculoskeletal: Normal range of motion. She exhibits no edema and no tenderness.  Lymphadenopathy:    She has no cervical adenopathy.  Neurological: She is alert and oriented to person, place, and time. She has normal reflexes. No cranial nerve deficit. Coordination normal.  Skin: Skin is warm and dry. No rash noted. She is not diaphoretic.  Psychiatric: Mood, memory and affect normal.       Assessment & Plan  Obesity Consider DASH diet, increase exercise, decrease po intake  Elevated BP Tolerable, encouraged DASH diet and avoid caffeine  Hyperlipidemia Avoid trans fats, increase execise start MegaRed caps daily  Hx of migraines No recent trouble  Thyroid disorder Thyroid studies wnl  Preventative health care Declines flu shot, encouraged to consider, dash diet, seat belts. Encouraged MGM at 41 yo

## 2012-05-18 NOTE — Assessment & Plan Note (Signed)
Tolerable, encouraged DASH diet and avoid caffeine

## 2012-05-18 NOTE — Assessment & Plan Note (Signed)
Thyroid studies wnl

## 2012-05-18 NOTE — Assessment & Plan Note (Signed)
Avoid trans fats, increase execise start MegaRed caps daily

## 2012-05-18 NOTE — Assessment & Plan Note (Signed)
Consider DASH diet, increase exercise, decrease po intake

## 2012-05-18 NOTE — Assessment & Plan Note (Signed)
No recent trouble.  

## 2012-09-22 ENCOUNTER — Other Ambulatory Visit: Payer: Self-pay

## 2012-11-15 ENCOUNTER — Ambulatory Visit: Payer: Self-pay | Admitting: Obstetrics

## 2012-11-15 ENCOUNTER — Ambulatory Visit (INDEPENDENT_AMBULATORY_CARE_PROVIDER_SITE_OTHER): Payer: PRIVATE HEALTH INSURANCE | Admitting: Obstetrics

## 2012-11-15 ENCOUNTER — Encounter: Payer: Self-pay | Admitting: Obstetrics

## 2012-11-15 VITALS — BP 137/90 | HR 67 | Temp 98.1°F | Ht 67.0 in | Wt 229.0 lb

## 2012-11-15 DIAGNOSIS — N946 Dysmenorrhea, unspecified: Secondary | ICD-10-CM

## 2012-11-15 DIAGNOSIS — Z113 Encounter for screening for infections with a predominantly sexual mode of transmission: Secondary | ICD-10-CM

## 2012-11-15 DIAGNOSIS — Z01419 Encounter for gynecological examination (general) (routine) without abnormal findings: Secondary | ICD-10-CM

## 2012-11-15 DIAGNOSIS — N921 Excessive and frequent menstruation with irregular cycle: Secondary | ICD-10-CM

## 2012-11-15 DIAGNOSIS — N760 Acute vaginitis: Secondary | ICD-10-CM

## 2012-11-15 DIAGNOSIS — Z0000 Encounter for general adult medical examination without abnormal findings: Secondary | ICD-10-CM

## 2012-11-15 MED ORDER — HYDROCODONE-ACETAMINOPHEN 5-300 MG PO TABS: 1.0000 | ORAL_TABLET | Freq: Four times a day (QID) | ORAL | Status: DC

## 2012-11-15 NOTE — Progress Notes (Signed)
.   Subjective:     Hannah Davis is a 42 y.o. female here for a routine exam.  Current complaints are abnormal discharge with an odor.  She had an ablation in 2011 and for the last couple of months has had light brown spotting.  Personal health questionnaire reviewed: yes.   Gynecologic History No LMP recorded. Patient has had an ablation. Contraception: tubal ligation Last Pap: 2008. Results were: normal Last mammogram: N/A    The following portions of the patient's history were reviewed and updated as appropriate: allergies, current medications, past family history, past medical history, past social history, past surgical history and problem list.  Review of Systems Pertinent items are noted in HPI.    Objective:    General appearance: alert and no distress Abdomen: normal findings: soft, non-tender Pelvic: cervix normal in appearance, external genitalia normal, no adnexal masses or tenderness, no cervical motion tenderness, rectovaginal septum normal, uterus normal size, shape, and consistency and vagina normal without discharge      Assessment:    Healthy female exam.    Plan:    Education reviewed: IUD side effects.

## 2012-11-16 DIAGNOSIS — N921 Excessive and frequent menstruation with irregular cycle: Secondary | ICD-10-CM | POA: Insufficient documentation

## 2012-11-16 LAB — PAP IG W/ RFLX HPV ASCU

## 2012-11-16 LAB — WET PREP BY MOLECULAR PROBE: Trichomonas vaginosis: NEGATIVE

## 2012-11-16 LAB — GC/CHLAMYDIA PROBE AMP: GC Probe RNA: NEGATIVE

## 2012-11-16 NOTE — Patient Instructions (Addendum)
Endometrial ablation

## 2012-11-29 ENCOUNTER — Other Ambulatory Visit: Payer: Self-pay | Admitting: *Deleted

## 2012-11-29 MED ORDER — FLUCONAZOLE 150 MG PO TABS: 150.0000 mg | ORAL_TABLET | Freq: Once | ORAL | Status: DC

## 2012-11-29 NOTE — Telephone Encounter (Signed)
Patient called regarding yeast infection symptoms.  She was treated 2 weeks ago for BV and now has severe vaginal itching with a thick white discharge.  Called Diflucan to [facility] per nursing protocol.

## 2012-12-03 ENCOUNTER — Ambulatory Visit (INDEPENDENT_AMBULATORY_CARE_PROVIDER_SITE_OTHER): Payer: PRIVATE HEALTH INSURANCE | Admitting: Obstetrics

## 2012-12-03 ENCOUNTER — Encounter: Payer: Self-pay | Admitting: Obstetrics

## 2012-12-03 VITALS — Temp 98.4°F | Ht 67.0 in | Wt 222.0 lb

## 2012-12-03 DIAGNOSIS — B373 Candidiasis of vulva and vagina: Secondary | ICD-10-CM

## 2012-12-03 DIAGNOSIS — N760 Acute vaginitis: Secondary | ICD-10-CM

## 2012-12-03 MED ORDER — FLUCONAZOLE 200 MG PO TABS: ORAL_TABLET | ORAL | Status: DC

## 2012-12-03 NOTE — Progress Notes (Signed)
.   Subjective:     Hannah Davis is a 41 y.o. female here for a problem visit.  Current complaints:abnormal discharge, itching and irritation.  Denies odor.   She was recently treated for BV and yeast within the last two weeks.  Personal health questionnaire reviewed: yes.   Gynecologic History No LMP recorded. Patient has had an ablation. Contraception: none Last Pap: 04.2014. Results were: normal Last mammogram:N/A  Obstetric History OB History   Grav Para Term Preterm Abortions TAB SAB Ect Mult Living                   The following portions of the patient's history were reviewed and updated as appropriate: allergies, current medications, past family history, past medical history, past social history, past surgical history and problem list.  Review of Systems Pertinent items are noted in HPI.    Objective:    General appearance: alert and no distress Pelvic: cervix normal in appearance, external genitalia normal, no cervical motion tenderness and Vagina with cheesy white discharge.    Assessment:    Candida Vaginitis.   Plan:    Education reviewed: safe sex/STD prevention and yeast vaginitis.. Follow up in: several months.   Annual

## 2012-12-04 LAB — WET PREP BY MOLECULAR PROBE
Candida species: NEGATIVE
Gardnerella vaginalis: NEGATIVE
Trichomonas vaginosis: NEGATIVE

## 2012-12-04 NOTE — Patient Instructions (Signed)
Candida Vaginitis

## 2012-12-06 ENCOUNTER — Encounter: Payer: Self-pay | Admitting: Obstetrics

## 2012-12-20 ENCOUNTER — Other Ambulatory Visit: Payer: Self-pay | Admitting: *Deleted

## 2012-12-20 DIAGNOSIS — N760 Acute vaginitis: Secondary | ICD-10-CM

## 2012-12-20 MED ORDER — METRONIDAZOLE 500 MG PO TABS: 500.0000 mg | ORAL_TABLET | Freq: Two times a day (BID) | ORAL | Status: DC

## 2013-01-01 ENCOUNTER — Encounter: Payer: Self-pay | Admitting: Obstetrics

## 2013-01-24 ENCOUNTER — Encounter: Payer: Self-pay | Admitting: Obstetrics & Gynecology

## 2013-01-28 ENCOUNTER — Encounter: Payer: Self-pay | Admitting: Obstetrics

## 2013-04-03 ENCOUNTER — Other Ambulatory Visit: Payer: Self-pay | Admitting: Obstetrics

## 2013-06-13 ENCOUNTER — Other Ambulatory Visit: Payer: Self-pay

## 2013-07-24 ENCOUNTER — Encounter: Payer: Self-pay | Admitting: Obstetrics

## 2013-07-24 DIAGNOSIS — B9689 Other specified bacterial agents as the cause of diseases classified elsewhere: Secondary | ICD-10-CM

## 2013-07-25 MED ORDER — METRONIDAZOLE 500 MG PO TABS: 500.0000 mg | ORAL_TABLET | Freq: Two times a day (BID) | ORAL | Status: DC

## 2013-08-05 ENCOUNTER — Other Ambulatory Visit: Payer: Self-pay | Admitting: *Deleted

## 2013-08-05 DIAGNOSIS — B9689 Other specified bacterial agents as the cause of diseases classified elsewhere: Secondary | ICD-10-CM

## 2013-08-05 MED ORDER — METRONIDAZOLE 500 MG PO TABS: 500.0000 mg | ORAL_TABLET | Freq: Two times a day (BID) | ORAL | Status: DC

## 2013-08-07 ENCOUNTER — Encounter: Payer: Self-pay | Admitting: Obstetrics

## 2013-08-14 ENCOUNTER — Encounter: Payer: Self-pay | Admitting: Obstetrics & Gynecology

## 2013-08-30 ENCOUNTER — Encounter: Payer: Self-pay | Admitting: Family Medicine

## 2013-08-30 ENCOUNTER — Ambulatory Visit (INDEPENDENT_AMBULATORY_CARE_PROVIDER_SITE_OTHER): Payer: 59 | Admitting: Family Medicine

## 2013-08-30 VITALS — BP 132/82 | HR 52 | Temp 98.3°F | Ht 67.0 in | Wt 168.0 lb

## 2013-08-30 DIAGNOSIS — E785 Hyperlipidemia, unspecified: Secondary | ICD-10-CM

## 2013-08-30 DIAGNOSIS — Z9889 Other specified postprocedural states: Secondary | ICD-10-CM

## 2013-08-30 DIAGNOSIS — Q211 Atrial septal defect: Secondary | ICD-10-CM

## 2013-08-30 DIAGNOSIS — Q2112 Patent foramen ovale: Secondary | ICD-10-CM

## 2013-08-30 DIAGNOSIS — E079 Disorder of thyroid, unspecified: Secondary | ICD-10-CM

## 2013-08-30 DIAGNOSIS — Q2111 Secundum atrial septal defect: Secondary | ICD-10-CM

## 2013-08-30 DIAGNOSIS — IMO0001 Reserved for inherently not codable concepts without codable children: Secondary | ICD-10-CM

## 2013-08-30 DIAGNOSIS — Z9884 Bariatric surgery status: Secondary | ICD-10-CM

## 2013-08-30 DIAGNOSIS — R002 Palpitations: Secondary | ICD-10-CM

## 2013-08-30 DIAGNOSIS — R030 Elevated blood-pressure reading, without diagnosis of hypertension: Secondary | ICD-10-CM

## 2013-08-30 DIAGNOSIS — I10 Essential (primary) hypertension: Secondary | ICD-10-CM

## 2013-08-30 LAB — CBC
HCT: 40.6 % (ref 36.0–46.0)
HEMOGLOBIN: 13.9 g/dL (ref 12.0–15.0)
MCH: 31 pg (ref 26.0–34.0)
MCHC: 34.2 g/dL (ref 30.0–36.0)
MCV: 90.4 fL (ref 78.0–100.0)
Platelets: 366 10*3/uL (ref 150–400)
RBC: 4.49 MIL/uL (ref 3.87–5.11)
RDW: 12.9 % (ref 11.5–15.5)
WBC: 7.3 10*3/uL (ref 4.0–10.5)

## 2013-08-30 LAB — RENAL FUNCTION PANEL
Albumin: 4 g/dL (ref 3.5–5.2)
BUN: 8 mg/dL (ref 6–23)
CALCIUM: 9.4 mg/dL (ref 8.4–10.5)
CO2: 26 mEq/L (ref 19–32)
Chloride: 102 mEq/L (ref 96–112)
Creat: 0.63 mg/dL (ref 0.50–1.10)
Glucose, Bld: 94 mg/dL (ref 70–99)
POTASSIUM: 4.4 meq/L (ref 3.5–5.3)
Phosphorus: 3.9 mg/dL (ref 2.3–4.6)
SODIUM: 136 meq/L (ref 135–145)

## 2013-08-30 LAB — HEPATIC FUNCTION PANEL
ALT: 25 U/L (ref 0–35)
AST: 25 U/L (ref 0–37)
Albumin: 4 g/dL (ref 3.5–5.2)
Alkaline Phosphatase: 63 U/L (ref 39–117)
BILIRUBIN DIRECT: 0.2 mg/dL (ref 0.0–0.3)
BILIRUBIN INDIRECT: 0.5 mg/dL (ref 0.0–0.9)
BILIRUBIN TOTAL: 0.7 mg/dL (ref 0.3–1.2)
Total Protein: 7.1 g/dL (ref 6.0–8.3)

## 2013-08-30 LAB — VITAMIN B12: Vitamin B-12: 330 pg/mL (ref 211–911)

## 2013-08-30 LAB — TSH: TSH: 1.941 u[IU]/mL (ref 0.350–4.500)

## 2013-08-30 MED ORDER — HYDROCHLOROTHIAZIDE 12.5 MG PO CAPS: 12.5000 mg | ORAL_CAPSULE | Freq: Every day | ORAL | Status: DC

## 2013-08-30 NOTE — Progress Notes (Signed)
Pre visit review using our clinic review tool, if applicable. No additional management support is needed unless otherwise documented below in the visit note. 

## 2013-08-30 NOTE — Patient Instructions (Signed)

## 2013-09-02 ENCOUNTER — Telehealth: Payer: Self-pay | Admitting: Family Medicine

## 2013-09-02 ENCOUNTER — Encounter: Payer: Self-pay | Admitting: Family Medicine

## 2013-09-02 NOTE — Assessment & Plan Note (Signed)
Improved with weight loss and improved diet

## 2013-09-02 NOTE — Assessment & Plan Note (Signed)
TSH wnl .  .

## 2013-09-02 NOTE — Telephone Encounter (Signed)
Relevant patient education assigned to patient using Emmi. ° °

## 2013-09-02 NOTE — Assessment & Plan Note (Signed)
Started on Microzide 12.5 mg daily

## 2013-09-02 NOTE — Progress Notes (Signed)
Patient ID: Hannah Davis, female   DOB: [birth year], 43 y.o.   MRN: [id number] Hannah Davis [id number] [birth year] 09/02/2013      Progress Note-Follow Up  Subjective  Chief Complaint  Chief Complaint  Patient presents with  . Follow-up    on high BP    HPI  Patient is a 43 year old Caucasian female in today to have high blood pressure evaluated. She had a screening at work which showed her blood pressure high. Denies headache, chest pain, shortness of breath, fevers, GI or GU concerns. Is [place]/p gastric sleeve in [place] and has lost a great deal of weight.   Past Medical History  Diagnosis Date  . Chicken pox as a child  . Obesity   . Thyroid disorder   . PFO (patent foramen ovale)   . Hx of migraines   . Elevated BP 06/05/2011  . Hyperlipidemia 06/05/2011  . Hip pain, left 06/05/2011  . Insomnia 06/05/2011  . Preventative health care 06/05/2011  . S/P gastric surgery     Past Surgical History  Procedure Laterality Date  . Endometrial ablation  11-11  . Tubes tied  3-03  . Arthroscopy on right wrist  1997  . Bunionectomy  2005    left foot big toe  . Cesarean section  1-00    Family History  Problem Relation Age of Onset  . Cancer Mother [id number]    kidney/ removed kidney in remission  . Fibromyalgia Mother   . Asthma Mother   . Stroke Mother   . Heart attack Mother   . Arthritis Mother     rheumatoid  . Diabetes Father     type 2  . Heart disease Father   . Other Father     open heart bypass surgery/ smoked  . Other Sister     kidney problems  . Heart disease Brother   . Other Brother     open heart surgery  . Drug abuse Brother     cocaine  . Other Maternal Grandmother     brain tumor  . Heart attack Maternal Grandfather   . Heart disease Maternal Grandfather     MI  . Stroke Paternal Grandmother [id number]  . Other Paternal Grandfather     enlarged heart  . Obesity Paternal Grandfather   . Stroke Brother     X 2    History   Social History   . Marital Status: Married    Spouse Name: N/A    Number of Children: N/A  . Years of Education: N/A   Occupational History  . Not on file.   Social History Main Topics  . Smoking status: Never Smoker   . Smokeless tobacco: Never Used  . Alcohol Use: No     Comment: a beer on the weekend  . Drug Use: No  . Sexual Activity: Yes    Partners: Male   Other Topics Concern  . Not on file   Social History Narrative  . No narrative on file    No current outpatient prescriptions on file prior to visit.   No current facility-administered medications on file prior to visit.    Allergies  Allergen Reactions  . Codeine Hives  . Ibuprofen Nausea And Vomiting    Review of Systems  Review of Systems  Constitutional: Negative for fever and malaise/fatigue.  HENT: Negative for congestion.   Eyes: Negative for discharge.  Respiratory: Negative for shortness of breath.   Cardiovascular: Negative for chest pain,  palpitations and leg swelling.  Gastrointestinal: Negative for nausea, abdominal pain and diarrhea.  Genitourinary: Negative for dysuria.  Musculoskeletal: Negative for falls.  Skin: Negative for rash.  Neurological: Negative for loss of consciousness and headaches.  Endo/Heme/Allergies: Negative for polydipsia.  Psychiatric/Behavioral: Negative for depression and suicidal ideas. The patient is not nervous/anxious and does not have insomnia.     Objective  BP 132/82  Pulse 52  Temp(Src) 98.3 F (36.8 C) (Oral)  Ht 5\' 7"  (1.702 m)  Wt 168 lb 0.6 oz (76.222 kg)  BMI 26.31 kg/m2  SpO2 99%  Physical Exam  Physical Exam  Constitutional: She is oriented to person, place, and time and well-developed, well-nourished, and in no distress. No distress.  HENT:  Head: Normocephalic and atraumatic.  Eyes: Conjunctivae are normal.  Neck: Neck supple. No thyromegaly present.  Cardiovascular: Normal rate, regular rhythm and normal heart sounds.   No murmur  heard. Pulmonary/Chest: Effort normal and breath sounds normal. She has no wheezes.  Abdominal: She exhibits no distension and no mass.  Musculoskeletal: She exhibits no edema.  Lymphadenopathy:    She has no cervical adenopathy.  Neurological: She is alert and oriented to person, place, and time.  Skin: Skin is warm and dry. No rash noted. She is not diaphoretic.  Psychiatric: Memory, affect and judgment normal.    Lab Results  Component Value Date   TSH 1.941 08/30/2013   Lab Results  Component Value Date   WBC 7.3 08/30/2013   HGB 13.9 08/30/2013   HCT 40.6 08/30/2013   MCV 90.4 08/30/2013   PLT 366 08/30/2013   Lab Results  Component Value Date   CREATININE 0.63 08/30/2013   BUN 8 08/30/2013   NA 136 08/30/2013   K 4.4 08/30/2013   CL 102 08/30/2013   CO2 26 08/30/2013   Lab Results  Component Value Date   ALT 25 08/30/2013   AST 25 08/30/2013   ALKPHOS 63 08/30/2013   BILITOT 0.7 08/30/2013   Lab Results  Component Value Date   CHOL 186 05/16/2012   Lab Results  Component Value Date   HDL 43.50 05/16/2012   Lab Results  Component Value Date   LDLCALC 128* 05/16/2012   Lab Results  Component Value Date   TRIG 73.0 05/16/2012   Lab Results  Component Value Date   CHOLHDL 4 05/16/2012     Assessment & Plan  PFO (patent foramen ovale) Per patient report will try and get patient records, now stuggling with some bradycardia and htn. Referred to cardiology.   Hyperlipidemia Improved with weight loss and improved diet  Elevated BP Started on Microzide 12.5 mg daily  Thyroid disorder TSH wnl

## 2013-09-02 NOTE — Assessment & Plan Note (Signed)
Per patient report will try and get patient records, now stuggling with some bradycardia and htn. Referred to cardiology.

## 2013-09-13 ENCOUNTER — Ambulatory Visit: Payer: 59 | Admitting: Cardiology

## 2013-10-03 ENCOUNTER — Ambulatory Visit: Payer: 59 | Admitting: Family Medicine

## 2013-10-18 ENCOUNTER — Ambulatory Visit: Payer: 59 | Admitting: Family Medicine

## 2013-10-24 ENCOUNTER — Other Ambulatory Visit: Payer: Self-pay | Admitting: *Deleted

## 2013-10-24 DIAGNOSIS — N760 Acute vaginitis: Principal | ICD-10-CM

## 2013-10-24 DIAGNOSIS — B9689 Other specified bacterial agents as the cause of diseases classified elsewhere: Secondary | ICD-10-CM

## 2013-10-24 MED ORDER — METRONIDAZOLE 500 MG PO TABS: 500.0000 mg | ORAL_TABLET | Freq: Two times a day (BID) | ORAL | Status: DC

## 2013-11-04 ENCOUNTER — Encounter: Payer: Self-pay | Admitting: Family Medicine

## 2013-11-04 ENCOUNTER — Ambulatory Visit (INDEPENDENT_AMBULATORY_CARE_PROVIDER_SITE_OTHER): Payer: 59 | Admitting: Family Medicine

## 2013-11-04 VITALS — BP 114/74 | HR 50 | Temp 97.9°F | Ht 67.0 in | Wt 157.1 lb

## 2013-11-04 DIAGNOSIS — Z9889 Other specified postprocedural states: Secondary | ICD-10-CM

## 2013-11-04 DIAGNOSIS — I10 Essential (primary) hypertension: Secondary | ICD-10-CM

## 2013-11-04 DIAGNOSIS — IMO0001 Reserved for inherently not codable concepts without codable children: Secondary | ICD-10-CM

## 2013-11-04 DIAGNOSIS — R001 Bradycardia, unspecified: Secondary | ICD-10-CM

## 2013-11-04 DIAGNOSIS — I498 Other specified cardiac arrhythmias: Secondary | ICD-10-CM

## 2013-11-04 DIAGNOSIS — R030 Elevated blood-pressure reading, without diagnosis of hypertension: Secondary | ICD-10-CM

## 2013-11-04 HISTORY — DX: Bradycardia, unspecified: R00.1

## 2013-11-04 MED ORDER — HYDROCHLOROTHIAZIDE 12.5 MG PO TABS: 6.2500 mg | ORAL_TABLET | Freq: Every day | ORAL | Status: DC

## 2013-11-04 NOTE — Progress Notes (Signed)
Patient ID: Hannah Davis, female   DOB: 08-24-1970, 43 y.o.   MRN: [id number] Hannah Davis [id number] 04/26/1971 11/04/2013      Progress Note-Follow Up  Subjective  Chief Complaint  Chief Complaint  Patient presents with  . Hypertension    follow up    HPI  Patient is a 43 year old female in today for routine medical care. She is in today for reevaluation of her blood pressure. She notes since starting HCTZ she has had frequent sensation of fatigue. She says upon rising in the morning she feels lightheaded and woozy. She had one episode of syncope at the grocery store with her daughter. She says it lasts only seconds and she sustained no injury. She did feel lightheaded before the episode. No incontinence. This has never happened before. She had no sense of palpitations, chest pain or shortness of breath at that time or otherwise. She has a long history of bradycardia and has been checking her pulse and she's been running between 45 and 55. Blood pressures on average at home have been in the 110 to 120s over 70s range. Today she had syncope she was 109/64 when she got home. She technologist since her gastric bypass has not been eating well and on average consumes about 800 calories a day. She reports she just does not get hungry.  Past Medical History  Diagnosis Date  . Chicken pox as a child  . Obesity   . Thyroid disorder   . PFO (patent foramen ovale)   . Hx of migraines   . Elevated BP 06/05/2011  . Hyperlipidemia 06/05/2011  . Hip pain, left 06/05/2011  . Insomnia 06/05/2011  . Preventative health care 06/05/2011  . S/P gastric surgery   . Bradycardia 11/04/2013    Past Surgical History  Procedure Laterality Date  . Endometrial ablation  11-11  . Tubes tied  3-03  . Arthroscopy on right wrist  1997  . Bunionectomy  2005    left foot big toe  . Cesarean section  1-00    Family History  Problem Relation Age of Onset  . Cancer Mother 53    kidney/ removed  kidney in remission  . Fibromyalgia Mother   . Asthma Mother   . Stroke Mother   . Heart attack Mother   . Arthritis Mother     rheumatoid  . Diabetes Father     type 2  . Heart disease Father   . Other Father     open heart bypass surgery/ smoked  . Other Sister     kidney problems  . Heart disease Brother   . Other Brother     open heart surgery  . Drug abuse Brother     cocaine  . Other Maternal Grandmother     brain tumor  . Heart attack Maternal Grandfather   . Heart disease Maternal Grandfather     MI  . Stroke Paternal Grandmother 15  . Other Paternal Grandfather     enlarged heart  . Obesity Paternal Grandfather   . Stroke Brother     X 2    History   Social History  . Marital Status: Married    Spouse Name: N/A    Number of Children: N/A  . Years of Education: N/A   Occupational History  . Not on file.   Social History Main Topics  . Smoking status: Never Smoker   . Smokeless tobacco: Never Used  . Alcohol Use: No  Comment: a beer on the weekend  . Drug Use: No  . Sexual Activity: Yes    Partners: Male   Other Topics Concern  . Not on file   Social History Narrative  . No narrative on file    No current outpatient prescriptions on file prior to visit.   No current facility-administered medications on file prior to visit.    Allergies  Allergen Reactions  . Codeine Hives  . Ibuprofen Nausea And Vomiting    Review of Systems  Review of Systems  Constitutional: Positive for malaise/fatigue. Negative for fever.  HENT: Negative for congestion.   Eyes: Negative for discharge.  Respiratory: Negative for shortness of breath.   Cardiovascular: Negative for chest pain, palpitations and leg swelling.  Gastrointestinal: Negative for nausea, abdominal pain and diarrhea.  Genitourinary: Negative for dysuria.  Musculoskeletal: Negative for falls.  Skin: Negative for rash.  Neurological: Positive for dizziness and loss of consciousness.  Negative for headaches.  Endo/Heme/Allergies: Negative for polydipsia.  Psychiatric/Behavioral: Negative for depression and suicidal ideas. The patient is not nervous/anxious and does not have insomnia.     Objective  BP 114/74  Pulse 50  Temp(Src) 97.9 F (36.6 C) (Oral)  Ht 5\' 7"  (1.702 m)  Wt 157 lb 1.9 oz (71.269 kg)  BMI 24.60 kg/m2  SpO2 99%  Physical Exam  Physical Exam  Constitutional: She is oriented to person, place, and time and well-developed, well-nourished, and in no distress. No distress.  HENT:  Head: Normocephalic and atraumatic.  Eyes: Conjunctivae are normal.  Neck: Neck supple. No thyromegaly present.  Cardiovascular: Regular rhythm and normal heart sounds.   No murmur heard. bradycardia  Pulmonary/Chest: Effort normal and breath sounds normal. She has no wheezes.  Abdominal: She exhibits no distension and no mass.  Musculoskeletal: She exhibits no edema.  Lymphadenopathy:    She has no cervical adenopathy.  Neurological: She is alert and oriented to person, place, and time.  Skin: Skin is warm and dry. No rash noted. She is not diaphoretic.  Psychiatric: Memory, affect and judgment normal.    Lab Results  Component Value Date   TSH 1.941 08/30/2013   Lab Results  Component Value Date   WBC 7.3 08/30/2013   HGB 13.9 08/30/2013   HCT 40.6 08/30/2013   MCV 90.4 08/30/2013   PLT 366 08/30/2013   Lab Results  Component Value Date   CREATININE 0.63 08/30/2013   BUN 8 08/30/2013   NA 136 08/30/2013   K 4.4 08/30/2013   CL 102 08/30/2013   CO2 26 08/30/2013   Lab Results  Component Value Date   ALT 25 08/30/2013   AST 25 08/30/2013   ALKPHOS 63 08/30/2013   BILITOT 0.7 08/30/2013   Lab Results  Component Value Date   CHOL 186 05/16/2012   Lab Results  Component Value Date   HDL 43.50 05/16/2012   Lab Results  Component Value Date   LDLCALC 128* 05/16/2012   Lab Results  Component Value Date   TRIG 73.0 05/16/2012   Lab Results  Component  Value Date   CHOLHDL 4 05/16/2012     Assessment & Plan  Elevated BP Feeling tired and lightheaded intermittently and one episode of syncope since last visit. Will drop to HCTZ 6.25 mg daily and she will keep a bp diary and food, drink and symptoms diary. Call if any other syncope  S/P gastric surgery Continues with poor appetitie guesses she is only eating 800 kcal daily, encouraged  to add protein and not skip meals  Bradycardia Long history, no changes she is warned if she has any further syncope she will need a referral to cardiology for further consideration

## 2013-11-04 NOTE — Assessment & Plan Note (Signed)
Continues with poor appetitie guesses she is only eating 800 kcal daily, encouraged to add protein and not skip meals

## 2013-11-04 NOTE — Assessment & Plan Note (Signed)
Long history, no changes she is warned if she has any further syncope she will need a referral to cardiology for further consideration

## 2013-11-04 NOTE — Progress Notes (Signed)
Pre visit review using our clinic review tool, if applicable. No additional management support is needed unless otherwise documented below in the visit note. 

## 2013-11-04 NOTE — Patient Instructions (Signed)
Orthostatic Hypotension °Orthostatic hypotension is a sudden fall in blood pressure. It occurs when a person goes from a sitting or lying position to a standing position. °CAUSES  °· Loss of body fluids (dehydration). °· Medicines that lower blood pressure. °· Sudden changes in posture, such as sudden standing when you have been sitting or lying down. °· Taking too much of your medicine. °SYMPTOMS  °· Lightheadedness or dizziness. °· Fainting or near-fainting. °· A fast heart rate (tachycardia). °· Weakness. °· Feeling tired (fatigue). °DIAGNOSIS  °Your caregiver may find the cause of orthostatic hypotension through: °· A history and/or physical exam. °· Checking your blood pressure. Your caregiver will check your blood pressure when you are: °· Lying down. °· Sitting. °· Standing. °· Tilt table testing. In this test, you are placed on a table that goes from a lying position to a standing position. You will be strapped to the table. This test helps to monitor your blood pressure and heart rate when you are in different positions. °TREATMENT  °· If orthostatic hypotension is caused by your medicines, your caregiver will need to adjust your dosage. Do not stop or adjust your medicine on your own. °· When changing positions, make these changes slowly. This allows your body to adjust to the different position. °· Compression stockings that are worn on your lower legs may be helpful. °· Your caregiver may have you consume extra salt. Do not add extra salt to your diet unless directed by your caregiver. °· Eat frequent, small meals. Avoid sudden standing after eating. °· Avoid hot showers or excessive heat. °· Your caregiver may give you fluids through the vein (intravenous). °· Your caregiver may put you on medicine to help enhance fluid retention. °SEEK IMMEDIATE MEDICAL CARE IF:  °· You faint or have a near-fainting episode. Call your local emergency services (911 in [place] °· You have or develop chest pain. °· You  feel sick to your stomach (nauseous) or vomit. °· You have a loss of feeling or movement in your arms or legs. °· You have difficulty talking, slurred speech, or you are unable to talk. °· You have difficulty thinking or have confused thinking. °MAKE SURE YOU:  °· Understand these instructions. °· Will watch your condition. °· Will get help right away if you are not doing well or get worse. °Document Released: 07/15/2002 Document Revised: 10/17/2011 Document Reviewed: 11/07/2008 °ExitCare® Patient Information ©2014 [employer]. ° °

## 2013-11-04 NOTE — Assessment & Plan Note (Signed)
Feeling tired and lightheaded intermittently and one episode of syncope since last visit. Will drop to HCTZ 6.25 mg daily and she will keep a bp diary and food, drink and symptoms diary. Call if any other syncope

## 2013-12-09 ENCOUNTER — Encounter: Payer: Self-pay | Admitting: Obstetrics

## 2013-12-09 ENCOUNTER — Ambulatory Visit (INDEPENDENT_AMBULATORY_CARE_PROVIDER_SITE_OTHER): Payer: 59 | Admitting: Obstetrics

## 2013-12-09 VITALS — BP 146/85 | HR 41 | Temp 98.2°F | Wt 157.0 lb

## 2013-12-09 DIAGNOSIS — A499 Bacterial infection, unspecified: Secondary | ICD-10-CM

## 2013-12-09 DIAGNOSIS — N760 Acute vaginitis: Secondary | ICD-10-CM

## 2013-12-09 DIAGNOSIS — B9689 Other specified bacterial agents as the cause of diseases classified elsewhere: Secondary | ICD-10-CM | POA: Insufficient documentation

## 2013-12-09 DIAGNOSIS — Z113 Encounter for screening for infections with a predominantly sexual mode of transmission: Secondary | ICD-10-CM

## 2013-12-09 NOTE — Addendum Note (Signed)
Addended by: Marya LandryFOSTER, Leiliana Foody D on: 12/09/2013 05:32 PM   Modules accepted: Orders

## 2013-12-09 NOTE — Progress Notes (Addendum)
Patient ID: Hannah Davis, female   DOB: Feb 04, 1971, 43 y.o.   MRN: [id number]  Chief Complaint  Patient presents with  . Personal Problem    seems to recurrent BV.  Pt had treatment in March with no relief.    HPI Hannah Davis is a 43 y.o. female.  Presents with vaginal discharge with odor.  HPI  Past Medical History  Diagnosis Date  . Chicken pox as a child  . Obesity   . Thyroid disorder   . PFO (patent foramen ovale)   . Hx of migraines   . Elevated BP 06/05/2011  . Hyperlipidemia 06/05/2011  . Hip pain, left 06/05/2011  . Insomnia 06/05/2011  . Preventative health care 06/05/2011  . S/P gastric surgery   . Bradycardia 11/04/2013    Past Surgical History  Procedure Laterality Date  . Endometrial ablation  11-11  . Tubes tied  3-03  . Arthroscopy on right wrist  1997  . Bunionectomy  2005    left foot big toe  . Cesarean section  1-00    Family History  Problem Relation Age of Onset  . Cancer Mother [id number]    kidney/ removed kidney in remission  . Fibromyalgia Mother   . Asthma Mother   . Stroke Mother   . Heart attack Mother   . Arthritis Mother     rheumatoid  . Diabetes Father     type 2  . Heart disease Father   . Other Father     open heart bypass surgery/ smoked  . Other Sister     kidney problems  . Heart disease Brother   . Other Brother     open heart surgery  . Drug abuse Brother     cocaine  . Other Maternal Grandmother     brain tumor  . Heart attack Maternal Grandfather   . Heart disease Maternal Grandfather     MI  . Stroke Paternal Grandmother 1070  . Other Paternal Grandfather     enlarged heart  . Obesity Paternal Grandfather   . Stroke Brother     X 2    Social History History  Substance Use Topics  . Smoking status: Never Smoker   . Smokeless tobacco: Never Used  . Alcohol Use: No     Comment: a beer on the weekend    Allergies  Allergen Reactions  . Codeine Hives  . Ibuprofen Nausea And Vomiting     Current Outpatient Prescriptions  Medication Sig Dispense Refill  . hydrochlorothiazide (HYDRODIURIL) 12.5 MG tablet Take 0.5 tablets (6.25 mg total) by mouth daily.  15 tablet  2   No current facility-administered medications for this visit.    Review of Systems Review of Systems Constitutional: negative for fatigue and weight loss Respiratory: negative for cough and wheezing Cardiovascular: negative for chest pain, fatigue and palpitations Gastrointestinal: negative for abdominal pain and change in bowel habits Genitourinary:  Vaginal discharge with odor Integument/breast: negative for nipple discharge Musculoskeletal:negative for myalgias Neurological: negative for gait problems and tremors Behavioral/Psych: negative for abusive relationship, depression Endocrine: negative for temperature intolerance     Blood pressure 146/85, pulse 41, temperature 98.2 F (36.8 C), weight 157 lb (71.215 kg).  Physical Exam Physical Exam General:   alert  Skin:   no rash or abnormalities  Lungs:   clear to auscultation bilaterally  Heart:   regular rate and rhythm, S1, S2 normal, no murmur, click, rub or gallop  Breasts:   normal  without suspicious masses, skin or nipple changes or axillary nodes  Abdomen:  normal findings: no organomegaly, soft, non-tender and no hernia  Pelvis:  External genitalia: normal general appearance Urinary system: urethral meatus normal and bladder without fullness, nontender Vaginal: normal without tenderness, induration or masses Cervix: normal appearance Adnexa: normal bimanual exam Uterus: anteverted and non-tender, normal size   Data Reviewed Labs reviewed:  Wet prep  Assessment    H/O Bacterial Vaginosis.  Normal exam today. Await Affirm results before treatment initiated.      Plan    Orders Placed This Encounter  Procedures  . HIV antibody  . Hepatitis B surface antigen  . RPR  . Hepatitis C antibody   No orders of the defined types  were placed in this encounter.    Need to obtain previous records Possible management options include:  [facility] for BV Follow up as needed.     Brock Badharles A Harper 12/09/2013, 5:09 PM

## 2013-12-10 ENCOUNTER — Other Ambulatory Visit: Payer: Self-pay | Admitting: *Deleted

## 2013-12-10 DIAGNOSIS — N760 Acute vaginitis: Principal | ICD-10-CM

## 2013-12-10 DIAGNOSIS — B9689 Other specified bacterial agents as the cause of diseases classified elsewhere: Secondary | ICD-10-CM

## 2013-12-10 DIAGNOSIS — B373 Candidiasis of vulva and vagina: Secondary | ICD-10-CM

## 2013-12-10 DIAGNOSIS — B3731 Acute candidiasis of vulva and vagina: Secondary | ICD-10-CM

## 2013-12-10 LAB — HIV ANTIBODY (ROUTINE TESTING W REFLEX): HIV: NONREACTIVE

## 2013-12-10 LAB — WET PREP BY MOLECULAR PROBE
CANDIDA SPECIES: NEGATIVE
GARDNERELLA VAGINALIS: POSITIVE — AB
TRICHOMONAS VAG: NEGATIVE

## 2013-12-10 LAB — GC/CHLAMYDIA PROBE AMP
CT Probe RNA: NEGATIVE
GC Probe RNA: NEGATIVE

## 2013-12-10 LAB — HEPATITIS C ANTIBODY: HCV AB: NEGATIVE

## 2013-12-10 LAB — RPR

## 2013-12-10 LAB — HEPATITIS B SURFACE ANTIGEN: HEP B S AG: NEGATIVE

## 2013-12-10 MED ORDER — FLUCONAZOLE 150 MG PO TABS: 150.0000 mg | ORAL_TABLET | Freq: Once | ORAL | Status: DC

## 2013-12-10 MED ORDER — CLINDAMYCIN PHOSPHATE (1 DOSE) 2 % VA CREA: 1.0000 | TOPICAL_CREAM | Freq: Once | VAGINAL | Status: DC

## 2013-12-16 ENCOUNTER — Ambulatory Visit: Payer: 59 | Admitting: Family Medicine

## 2014-03-11 ENCOUNTER — Telehealth: Payer: Self-pay | Admitting: *Deleted

## 2014-03-11 NOTE — Telephone Encounter (Signed)
Fax from pharmacy requesting refill of Metronidazole 500 mg #14

## 2014-03-12 ENCOUNTER — Other Ambulatory Visit: Payer: Self-pay | Admitting: Obstetrics

## 2014-03-12 DIAGNOSIS — B9689 Other specified bacterial agents as the cause of diseases classified elsewhere: Secondary | ICD-10-CM

## 2014-03-12 DIAGNOSIS — N760 Acute vaginitis: Principal | ICD-10-CM

## 2014-03-12 MED ORDER — METRONIDAZOLE 500 MG PO TABS: 500.0000 mg | ORAL_TABLET | Freq: Two times a day (BID) | ORAL | Status: DC

## 2014-07-11 ENCOUNTER — Encounter: Payer: Self-pay | Admitting: Family Medicine

## 2014-07-11 ENCOUNTER — Ambulatory Visit (INDEPENDENT_AMBULATORY_CARE_PROVIDER_SITE_OTHER): Payer: 59 | Admitting: Family Medicine

## 2014-07-11 VITALS — BP 142/84 | HR 70 | Temp 99.2°F | Ht 67.0 in | Wt 153.6 lb

## 2014-07-11 DIAGNOSIS — IMO0001 Reserved for inherently not codable concepts without codable children: Secondary | ICD-10-CM

## 2014-07-11 DIAGNOSIS — R001 Bradycardia, unspecified: Secondary | ICD-10-CM

## 2014-07-11 DIAGNOSIS — R030 Elevated blood-pressure reading, without diagnosis of hypertension: Secondary | ICD-10-CM

## 2014-07-11 DIAGNOSIS — E785 Hyperlipidemia, unspecified: Secondary | ICD-10-CM

## 2014-07-11 DIAGNOSIS — Z01818 Encounter for other preprocedural examination: Secondary | ICD-10-CM

## 2014-07-11 DIAGNOSIS — M1612 Unilateral primary osteoarthritis, left hip: Secondary | ICD-10-CM

## 2014-07-11 DIAGNOSIS — Q2112 Patent foramen ovale: Secondary | ICD-10-CM

## 2014-07-11 DIAGNOSIS — Q211 Atrial septal defect: Secondary | ICD-10-CM

## 2014-07-11 LAB — RENAL FUNCTION PANEL
Albumin: 3.6 g/dL (ref 3.5–5.2)
BUN: 13 mg/dL (ref 6–23)
CHLORIDE: 105 meq/L (ref 96–112)
CO2: 27 mEq/L (ref 19–32)
Calcium: 9.3 mg/dL (ref 8.4–10.5)
Creat: 0.75 mg/dL (ref 0.50–1.10)
GLUCOSE: 91 mg/dL (ref 70–99)
PHOSPHORUS: 3.3 mg/dL (ref 2.3–4.6)
POTASSIUM: 5 meq/L (ref 3.5–5.3)
Sodium: 141 mEq/L (ref 135–145)

## 2014-07-11 LAB — CBC
HEMATOCRIT: 37.9 % (ref 36.0–46.0)
Hemoglobin: 13.4 g/dL (ref 12.0–15.0)
MCH: 31.5 pg (ref 26.0–34.0)
MCHC: 35.4 g/dL (ref 30.0–36.0)
MCV: 89.2 fL (ref 78.0–100.0)
MPV: 9.4 fL (ref 9.4–12.4)
PLATELETS: 303 10*3/uL (ref 150–400)
RBC: 4.25 MIL/uL (ref 3.87–5.11)
RDW: 13.2 % (ref 11.5–15.5)
WBC: 7.6 10*3/uL (ref 4.0–10.5)

## 2014-07-11 LAB — HEPATIC FUNCTION PANEL
ALT: 15 U/L (ref 0–35)
AST: 20 U/L (ref 0–37)
Albumin: 3.6 g/dL (ref 3.5–5.2)
Alkaline Phosphatase: 44 U/L (ref 39–117)
BILIRUBIN DIRECT: 0.1 mg/dL (ref 0.0–0.3)
BILIRUBIN TOTAL: 0.5 mg/dL (ref 0.2–1.2)
Indirect Bilirubin: 0.4 mg/dL (ref 0.2–1.2)
Total Protein: 6.7 g/dL (ref 6.0–8.3)

## 2014-07-11 NOTE — Patient Instructions (Signed)

## 2014-07-11 NOTE — Progress Notes (Signed)
Pre visit review using our clinic review tool, if applicable. No additional management support is needed unless otherwise documented below in the visit note. 

## 2014-07-12 LAB — TSH: TSH: 1.744 u[IU]/mL (ref 0.350–4.500)

## 2014-07-13 ENCOUNTER — Encounter: Payer: Self-pay | Admitting: Family Medicine

## 2014-07-13 DIAGNOSIS — M169 Osteoarthritis of hip, unspecified: Secondary | ICD-10-CM

## 2014-07-13 HISTORY — DX: Osteoarthritis of hip, unspecified: M16.9

## 2014-07-13 NOTE — Assessment & Plan Note (Signed)
Encouraged heart healthy diet, increase exercise, avoid trans fats, consider a krill oil cap daily 

## 2014-07-13 NOTE — Assessment & Plan Note (Signed)
Is here today for preop clearance. She is scheduled for left hip THR on 08/26/14 with Dr [facility]. She is anxious to proceed due to her level of pain. Labs unremarkable today. Patient cleared for surgery pending completion of her 2D echo to evaluate her heart murmur due to history of PFO.

## 2014-07-13 NOTE — Assessment & Plan Note (Signed)
Mild elevation, minimize sodium and caffeine.

## 2014-07-13 NOTE — Progress Notes (Signed)
Hannah Davis  [id number] 1971-03-23 07/13/2014      Progress Note-Follow Up  Subjective  Chief Complaint  Chief Complaint  Patient presents with  . surgery clearance    for left hip    HPI  Patient is a 43 y.o. female in today for routine medical care. She is here today for pre op clearance. She has chronic left hip pain and is anxious to proceed with hip replacement with Dr Charlann Boxer in January. No other acute concerns and otherwise she is doing well. Denies CP/palp/SOB/HA/congestion/fevers/GI or GU c/o. Taking meds as prescribed  Past Medical History  Diagnosis Date  . Chicken pox as a child  . Obesity   . Thyroid disorder   . PFO (patent foramen ovale)   . Hx of migraines   . Elevated BP 06/05/2011  . Hyperlipidemia 06/05/2011  . Hip pain, left 06/05/2011  . Insomnia 06/05/2011  . Preventative health care 06/05/2011  . S/P gastric surgery   . Bradycardia 11/04/2013  . 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 Endometrial ablation  11-11  . Tubes tied  3-03  . Arthroscopy on right wrist  1997  . Bunionectomy  2005    left foot big toe  . Cesarean section  1-00    Family History  Problem Relation Age of Onset  . Cancer Mother 15    kidney/ removed kidney in remission  . Fibromyalgia Mother   . Asthma Mother   . Stroke Mother   . Heart attack Mother   . Arthritis Mother     rheumatoid  . Diabetes Father     type 2  . Heart disease Father   . Other Father     open heart bypass surgery/ smoked  . Other Sister     kidney problems  . Heart disease Brother   . Other Brother     open heart surgery  . Drug abuse Brother     cocaine  . Other Maternal Grandmother     brain tumor  . Heart attack Maternal Grandfather   . Heart disease Maternal Grandfather     MI  . Stroke Paternal Grandmother 1  . Other Paternal Grandfather     enlarged heart  . Obesity Paternal Grandfather   . Stroke  Brother     X 2    History   Social History  . Marital Status: Married    Spouse Name: N/A    Number of Children: N/A  . Years of Education: N/A   Occupational History  . Not on file.   Social History Main Topics  . Smoking status: Never Smoker   . Smokeless tobacco: Never Used  . Alcohol Use: No     Comment: a beer on the weekend  . Drug Use: No  . Sexual Activity:    Partners: Male   Other Topics Concern  . Not on file   Social History Narrative    No current outpatient prescriptions on file prior to visit.   No current facility-administered medications on file prior to visit.    Allergies  Allergen Reactions  . Codeine Hives  . Ibuprofen Nausea And Vomiting    Review of Systems  Review of Systems  Constitutional: Negative for fever, chills and malaise/fatigue.  HENT: Negative for congestion, hearing loss and nosebleeds.   Eyes: Negative for discharge.  Respiratory: Negative for cough, sputum production, shortness of breath and  wheezing.   Cardiovascular: Negative for chest pain, palpitations and leg swelling.  Gastrointestinal: Negative for heartburn, nausea, vomiting, abdominal pain, diarrhea, constipation and blood in stool.  Genitourinary: Negative for dysuria, urgency, frequency and hematuria.  Musculoskeletal: Negative for myalgias, back pain and falls.  Skin: Negative for rash.  Neurological: Negative for dizziness, tremors, sensory change, focal weakness, loss of consciousness, weakness and headaches.  Endo/Heme/Allergies: Negative for polydipsia. Does not bruise/bleed easily.  Psychiatric/Behavioral: Negative for depression and suicidal ideas. The patient is not nervous/anxious and does not have insomnia.     Objective  BP 142/84 mmHg  Pulse 70  Temp(Src) 99.2 F (37.3 C) (Oral)  Ht 5\' 7"  (1.702 m)  Wt 153 lb 9.6 oz (69.673 kg)  BMI 24.05 kg/m2  SpO2 97%  Physical Exam  Physical Exam  Constitutional: She is oriented to person, place,  and time and well-developed, well-nourished, and in no distress. No distress.  HENT:  Head: Normocephalic and atraumatic.  Right Ear: External ear normal.  Left Ear: External ear normal.  Nose: Nose normal.  Mouth/Throat: Oropharynx is clear and moist. No oropharyngeal exudate.  Eyes: Conjunctivae are normal. Pupils are equal, round, and reactive to light. Right eye exhibits no discharge. Left eye exhibits no discharge. No scleral icterus.  Neck: Normal range of motion. Neck supple. No thyromegaly present.  Cardiovascular: Normal rate, regular rhythm and intact distal pulses.   Murmur heard. 2/6 murmur  Pulmonary/Chest: Effort normal and breath sounds normal. No respiratory distress. She has no wheezes. She has no rales.  Abdominal: Soft. Bowel sounds are normal. She exhibits no distension and no mass. There is no tenderness.  Musculoskeletal: Normal range of motion. She exhibits no edema or tenderness.  Lymphadenopathy:    She has no cervical adenopathy.  Neurological: She is alert and oriented to person, place, and time. She has normal reflexes. No cranial nerve deficit. Coordination normal.  Skin: Skin is warm and dry. No rash noted. She is not diaphoretic.  Psychiatric: Mood, memory and affect normal.    Lab Results  Component Value Date   TSH 1.744 07/11/2014   Lab Results  Component Value Date   WBC 7.6 07/11/2014   HGB 13.4 07/11/2014   HCT 37.9 07/11/2014   MCV 89.2 07/11/2014   PLT 303 07/11/2014   Lab Results  Component Value Date   CREATININE 0.75 07/11/2014   BUN 13 07/11/2014   NA 141 07/11/2014   K 5.0 07/11/2014   CL 105 07/11/2014   CO2 27 07/11/2014   Lab Results  Component Value Date   ALT 15 07/11/2014   AST 20 07/11/2014   ALKPHOS 44 07/11/2014   BILITOT 0.5 07/11/2014   Lab Results  Component Value Date   CHOL 186 05/16/2012   Lab Results  Component Value Date   HDL 43.50 05/16/2012   Lab Results  Component Value Date   LDLCALC 128*  05/16/2012   Lab Results  Component Value Date   TRIG 73.0 05/16/2012   Lab Results  Component Value Date   CHOLHDL 4 05/16/2012     Assessment & Plan  Elevated BP Mild elevation, minimize sodium and caffeine.  Bradycardia RRR today  Hyperlipidemia Encouraged heart healthy diet, increase exercise, avoid trans fats, consider a krill oil cap daily  Degenerative joint disease (DJD) of hip Is here today for preop clearance. She is scheduled for left hip THR on 08/26/14 with Dr [facility]. She is anxious to proceed due to her level  of pain. Labs unremarkable today. Patient cleared for surgery pending completion of her 2D echo to evaluate her heart murmur due to history of PFO.

## 2014-07-13 NOTE — Assessment & Plan Note (Signed)
RRR today 

## 2014-07-14 ENCOUNTER — Telehealth: Payer: Self-pay

## 2014-07-14 NOTE — Telephone Encounter (Signed)
Notes faxed to [facility] at [phone]. Fax confirmation received. JG//CMA

## 2014-07-14 NOTE — Telephone Encounter (Signed)
-----   Message from Bradd CanaryStacey A Blyth, MD sent at 07/13/2014 11:17 PM EST ----- Please send a copy of this note to [facility] for preop clearance.

## 2014-07-14 NOTE — Telephone Encounter (Signed)
Printed note and put in Jessica's file to fax

## 2014-07-23 ENCOUNTER — Ambulatory Visit (HOSPITAL_BASED_OUTPATIENT_CLINIC_OR_DEPARTMENT_OTHER): Payer: 59

## 2014-08-04 ENCOUNTER — Encounter: Payer: Self-pay | Admitting: *Deleted

## 2014-08-11 ENCOUNTER — Ambulatory Visit (HOSPITAL_COMMUNITY)
Admission: RE | Admit: 2014-08-11 | Discharge: 2014-08-11 | Disposition: A | Discharge status: Home / Self Care | Payer: 59 | Source: Ambulatory Visit | Attending: Family Medicine | Admitting: Family Medicine

## 2014-08-11 DIAGNOSIS — Q211 Atrial septal defect: Secondary | ICD-10-CM | POA: Diagnosis not present

## 2014-08-11 DIAGNOSIS — E785 Hyperlipidemia, unspecified: Secondary | ICD-10-CM | POA: Insufficient documentation

## 2014-08-11 DIAGNOSIS — Q2112 Patent foramen ovale: Secondary | ICD-10-CM

## 2014-08-11 DIAGNOSIS — R030 Elevated blood-pressure reading, without diagnosis of hypertension: Secondary | ICD-10-CM | POA: Diagnosis not present

## 2014-08-11 DIAGNOSIS — Z01818 Encounter for other preprocedural examination: Secondary | ICD-10-CM | POA: Insufficient documentation

## 2014-08-11 DIAGNOSIS — IMO0001 Reserved for inherently not codable concepts without codable children: Secondary | ICD-10-CM

## 2014-08-11 DIAGNOSIS — I369 Nonrheumatic tricuspid valve disorder, unspecified: Secondary | ICD-10-CM

## 2014-08-11 NOTE — Progress Notes (Signed)
  Echocardiogram 2D Echocardiogram has been performed.  Arvil Chaco 08/11/2014, 10:12 AM

## 2014-08-12 ENCOUNTER — Encounter: Payer: Self-pay | Admitting: Family Medicine

## 2014-08-15 ENCOUNTER — Other Ambulatory Visit: Payer: Self-pay | Admitting: Family Medicine

## 2014-08-15 ENCOUNTER — Other Ambulatory Visit (HOSPITAL_COMMUNITY): Payer: Self-pay | Admitting: *Deleted

## 2014-08-15 DIAGNOSIS — R012 Other cardiac sounds: Secondary | ICD-10-CM

## 2014-08-15 NOTE — Patient Instructions (Addendum)
Edson SnowballShana A Romedy-Gray  08/15/2014   Your procedure is scheduled on: 08/26/13   Report to [facility]  Entrance and follow signs to               Short Stay Center at 7:00 AM.   Call this number if you have problems the morning of surgery [phone]   Remember:  Do not eat food or drink liquids :After Midnight.     Take these medicines the morning of surgery with A SIP OF WATER: TRAMADOL IF NEEDED                               You may not have any metal on your body including hair pins and              piercings  Do not wear jewelry, make-up, lotions, powders or perfumes.             Do not wear nail polish.  Do not shave  48 hours prior to surgery.              Men may shave face and neck.   Do not bring valuables to the hospital. [facility] IS NOT             RESPONSIBLE   FOR VALUABLES.  Contacts, dentures or bridgework may not be worn into surgery.  Leave suitcase in the car. After surgery it may be brought to your room.     Patients discharged the day of surgery will not be allowed to drive home.  Name and phone number of your driver:  Special Instructions: N/A              Please read over the following fact sheets you were given: _____________________________________________________________________                                                     [facility] - PREPARING FOR SURGERY  Before surgery, you can play an important role.  Because skin is not sterile, your skin needs to be as free of germs as possible.  You can reduce the number of germs on your skin by washing with CHG (chlorahexidine gluconate) soap before surgery.  CHG is an antiseptic cleaner which kills germs and bonds with the skin to continue killing germs even after washing. Please DO NOT use if you have an allergy to CHG or antibacterial soaps.  If your skin becomes reddened/irritated stop using the CHG and inform your nurse when you arrive at Short Stay. Do not shave  (including legs and underarms) for at least 48 hours prior to the first CHG shower.  You may shave your face. Please follow these instructions carefully:   1.  Shower with CHG Soap the night before surgery and the  morning of Surgery.   2.  If you choose to wash your hair, wash your hair first as usual with your  normal  Shampoo.   3.  After you shampoo, rinse your hair and body thoroughly to remove the  shampoo.  4.  Use CHG as you would any other liquid soap.  You can apply chg directly  to the skin and wash . Gently wash with scrungie or clean wascloth    5.  Apply the CHG Soap to your body ONLY FROM THE NECK DOWN.   Do not use on open                           Wound or open sores. Avoid contact with eyes, ears mouth and genitals (private parts).                        Genitals (private parts) with your normal soap.              6.  Wash thoroughly, paying special attention to the area where your surgery  will be performed.   7.  Thoroughly rinse your body with warm water from the neck down.   8.  DO NOT shower/wash with your normal soap after using and rinsing off  the CHG Soap .                9.  Pat yourself dry with a clean towel.             10.  Wear clean pajamas.             11.  Place clean sheets on your bed the night of your first shower and do not  sleep with pets.  Day of Surgery : Do not apply any lotions/deodorants the morning of surgery.  Please wear clean clothes to the hospital/surgery center.  FAILURE TO FOLLOW THESE INSTRUCTIONS MAY RESULT IN THE CANCELLATION OF YOUR SURGERY    PATIENT SIGNATURE_________________________________  ______________________________________________________________________     Adam Phenix  An incentive spirometer is a tool that can help keep your lungs clear and active. This tool measures how well you are filling your lungs with each breath. Taking long deep breaths may help  reverse or decrease the chance of developing breathing (pulmonary) problems (especially infection) following:  A long period of time when you are unable to move or be active. BEFORE THE PROCEDURE   If the spirometer includes an indicator to show your best effort, your nurse or respiratory therapist will set it to a desired goal.  If possible, sit up straight or lean slightly forward. Try not to slouch.  Hold the incentive spirometer in an upright position. INSTRUCTIONS FOR USE   Sit on the edge of your bed if possible, or sit up as far as you can in bed or on a chair.  Hold the incentive spirometer in an upright position.  Breathe out normally.  Place the mouthpiece in your mouth and seal your lips tightly around it.  Breathe in slowly and as deeply as possible, raising the piston or the ball toward the top of the column.  Hold your breath for 3-5 seconds or for as long as possible. Allow the piston or ball to fall to the bottom of the column.  Remove the mouthpiece from your mouth and breathe out normally.  Rest for a few seconds and repeat Steps 1 through 7 at least 10 times every 1-2 hours when you are awake. Take your time and take a few normal breaths between deep breaths.  The spirometer may include an indicator to show your best effort. Use the indicator as a goal to work toward during  each repetition.  After each set of 10 deep breaths, practice coughing to be sure your lungs are clear. If you have an incision (the cut made at the time of surgery), support your incision when coughing by placing a pillow or rolled up towels firmly against it. Once you are able to get out of bed, walk around indoors and cough well. You may stop using the incentive spirometer when instructed by your caregiver.  RISKS AND COMPLICATIONS  Take your time so you do not get dizzy or light-headed.  If you are in pain, you may need to take or ask for pain medication before doing incentive spirometry.  It is harder to take a deep breath if you are having pain. AFTER USE  Rest and breathe slowly and easily.  It can be helpful to keep track of a log of your progress. Your caregiver can provide you with a simple table to help with this. If you are using the spirometer at home, follow these instructions: [place] IF:   You are having difficultly using the spirometer.  You have trouble using the spirometer as often as instructed.  Your pain medication is not giving enough relief while using the spirometer.  You develop fever of 100.5 F (38.1 C) or higher. SEEK IMMEDIATE MEDICAL CARE IF:   You cough up bloody sputum that had not been present before.  You develop fever of 102 F (38.9 C) or greater.  You develop worsening pain at or near the incision site. MAKE SURE YOU:   Understand these instructions.  Will watch your condition.  Will get help right away if you are not doing well or get worse. Document Released: 12/05/2006 Document Revised: 10/17/2011 Document Reviewed: 02/05/2007 ExitCare Patient Information 2014 ExitCare, [place].   ________________________________________________________________________  WHAT IS A BLOOD TRANSFUSION? Blood Transfusion Information  A transfusion is the replacement of blood or some of its parts. Blood is made up of multiple cells which provide different functions.  Red blood cells carry oxygen and are used for blood loss replacement.  White blood cells fight against infection.  Platelets control bleeding.  Plasma helps clot blood.  Other blood products are available for specialized needs, such as hemophilia or other clotting disorders. BEFORE THE TRANSFUSION  Who gives blood for transfusions?   Healthy volunteers who are fully evaluated to make sure their blood is safe. This is blood bank blood. Transfusion therapy is the safest it has ever been in the practice of medicine. Before blood is taken from a donor, a complete  history is taken to make sure that person has no history of diseases nor engages in risky social behavior (examples are intravenous drug use or sexual activity with multiple partners). The donor's travel history is screened to minimize risk of transmitting infections, such as malaria. The donated blood is tested for signs of infectious diseases, such as HIV and hepatitis. The blood is then tested to be sure it is compatible with you in order to minimize the chance of a transfusion reaction. If you or a relative donates blood, this is often done in anticipation of surgery and is not appropriate for emergency situations. It takes many days to process the donated blood. RISKS AND COMPLICATIONS Although transfusion therapy is very safe and saves many lives, the main dangers of transfusion include:   Getting an infectious disease.  Developing a transfusion reaction. This is an allergic reaction to something in the blood you were given. Every precaution is taken to prevent  this. The decision to have a blood transfusion has been considered carefully by your caregiver before blood is given. Blood is not given unless the benefits outweigh the risks. AFTER THE TRANSFUSION  Right after receiving a blood transfusion, you will usually feel much better and more energetic. This is especially true if your red blood cells have gotten low (anemic). The transfusion raises the level of the red blood cells which carry oxygen, and this usually causes an energy increase.  The nurse administering the transfusion will monitor you carefully for complications. HOME CARE INSTRUCTIONS  No special instructions are needed after a transfusion. You may find your energy is better. Speak with your caregiver about any limitations on activity for underlying diseases you may have. SEEK MEDICAL CARE IF:   Your condition is not improving after your transfusion.  You develop redness or irritation at the intravenous (IV) site. SEEK  IMMEDIATE MEDICAL CARE IF:  Any of the following symptoms occur over the next 12 hours:  Shaking chills.  You have a temperature by mouth above 102 F (38.9 C), not controlled by medicine.  Chest, back, or muscle pain.  People around you feel you are not acting correctly or are confused.  Shortness of breath or difficulty breathing.  Dizziness and fainting.  You get a rash or develop hives.  You have a decrease in urine output.  Your urine turns a dark color or changes to pink, red, or brown. Any of the following symptoms occur over the next 10 days:  You have a temperature by mouth above 102 F (38.9 C), not controlled by medicine.  Shortness of breath.  Weakness after normal activity.  The white part of the eye turns yellow (jaundice).  You have a decrease in the amount of urine or are urinating less often.  Your urine turns a dark color or changes to pink, red, or brown. Document Released: 07/22/2000 Document Revised: 10/17/2011 Document Reviewed: 03/10/2008 [facility] Patient Information 2014 [place], [place].  _______________________________________________________________________

## 2014-08-18 ENCOUNTER — Telehealth: Payer: Self-pay

## 2014-08-18 ENCOUNTER — Other Ambulatory Visit: Payer: Self-pay | Admitting: Family Medicine

## 2014-08-18 ENCOUNTER — Ambulatory Visit (HOSPITAL_COMMUNITY)
Admission: RE | Admit: 2014-08-18 | Discharge: 2014-08-18 | Disposition: A | Discharge status: Home / Self Care | Payer: 59 | Source: Ambulatory Visit | Attending: Anesthesiology | Admitting: Anesthesiology

## 2014-08-18 ENCOUNTER — Encounter (HOSPITAL_COMMUNITY)
Admission: RE | Admit: 2014-08-18 | Discharge: 2014-08-18 | Disposition: A | Discharge status: Home / Self Care | Payer: 59 | Source: Ambulatory Visit | Attending: Orthopedic Surgery | Admitting: Orthopedic Surgery

## 2014-08-18 ENCOUNTER — Encounter (HOSPITAL_COMMUNITY): Payer: Self-pay

## 2014-08-18 DIAGNOSIS — M5134 Other intervertebral disc degeneration, thoracic region: Secondary | ICD-10-CM | POA: Insufficient documentation

## 2014-08-18 DIAGNOSIS — I10 Essential (primary) hypertension: Secondary | ICD-10-CM

## 2014-08-18 DIAGNOSIS — Z01818 Encounter for other preprocedural examination: Secondary | ICD-10-CM | POA: Insufficient documentation

## 2014-08-18 DIAGNOSIS — R012 Other cardiac sounds: Secondary | ICD-10-CM

## 2014-08-18 HISTORY — DX: Family history of other specified conditions: Z84.89

## 2014-08-18 HISTORY — DX: Personal history of other malignant neoplasm of skin: Z85.828

## 2014-08-18 HISTORY — DX: Elevated blood-pressure reading, without diagnosis of hypertension: R03.0

## 2014-08-18 HISTORY — DX: Personal history of urinary calculi: Z87.442

## 2014-08-18 LAB — URINALYSIS, ROUTINE W REFLEX MICROSCOPIC
BILIRUBIN URINE: NEGATIVE
Glucose, UA: NEGATIVE mg/dL
Hgb urine dipstick: NEGATIVE
KETONES UR: NEGATIVE mg/dL
LEUKOCYTES UA: NEGATIVE
Nitrite: NEGATIVE
PROTEIN: NEGATIVE mg/dL
Specific Gravity, Urine: 1.015 (ref 1.005–1.030)
Urobilinogen, UA: 0.2 mg/dL (ref 0.0–1.0)
pH: 5 (ref 5.0–8.0)

## 2014-08-18 LAB — BASIC METABOLIC PANEL
ANION GAP: 9 (ref 5–15)
BUN: 14 mg/dL (ref 6–23)
CO2: 28 mmol/L (ref 19–32)
Calcium: 9.3 mg/dL (ref 8.4–10.5)
Chloride: 104 mEq/L (ref 96–112)
Creatinine, Ser: 0.68 mg/dL (ref 0.50–1.10)
GFR calc non Af Amer: >90 mL/min (ref >90)
Glucose, Bld: 96 mg/dL (ref 70–99)
Potassium: 4 mmol/L (ref 3.5–5.1)
Sodium: 141 mmol/L (ref 135–145)

## 2014-08-18 LAB — CBC
HCT: 39.9 % (ref 36.0–46.0)
HEMOGLOBIN: 13.5 g/dL (ref 12.0–15.0)
MCH: 31.3 pg (ref 26.0–34.0)
MCHC: 33.8 g/dL (ref 30.0–36.0)
MCV: 92.4 fL (ref 78.0–100.0)
PLATELETS: 287 10*3/uL (ref 150–400)
RBC: 4.32 MIL/uL (ref 3.87–5.11)
RDW: 12 % (ref 11.5–15.5)
WBC: 5 10*3/uL (ref 4.0–10.5)

## 2014-08-18 LAB — PROTIME-INR
INR: 1.01 (ref 0.00–1.49)
Prothrombin Time: 13.5 s (ref 11.6–15.2)

## 2014-08-18 LAB — SURGICAL PCR SCREEN
MRSA, PCR: NEGATIVE
Staphylococcus aureus: POSITIVE — AB

## 2014-08-18 LAB — ABO/RH: ABO/RH(D): O POS

## 2014-08-18 LAB — HCG, SERUM, QUALITATIVE: Preg, Serum: NEGATIVE

## 2014-08-18 LAB — APTT: aPTT: 32 seconds (ref 24–37)

## 2014-08-18 NOTE — Telephone Encounter (Signed)
Spoke with patient who states that she is not happy about this. But, she will do what she has to do for the surgery to be completed.   Obtained new insurance information. [facility] 08/19/2014.

## 2014-08-18 NOTE — Telephone Encounter (Signed)
-----   Message from Bradd CanaryStacey A Blyth, MD sent at 08/15/2014 11:36 PM EST -----    Need help with this urgently. She has hip surgery scheduled for 1/19 and her echo was normal except they could not rule out a PFO and the recommended a agitated saline study. Could not find this exact order in EPIC so I ordered a echo with bubble study then wrote in for agitated saline study. Please check with cardiology did I order it right? I ordered [facility] at [facility] can it be done there? If not we need to move it quickly. Try and get the test as quick as possible so she can proceed with her surgery. Can you also check with her surgeon, dr Charlann Boxerlin I believe and see if they have a preop clearance form? Or they just want a letter I have not seen anything from them only from patient. She is cleared pending completion of PFO studies so I can complete most of the paperwork. If we cannot complete her test this week let me know ASAP because her surgery will be in jeopardy. Thanks   [facility]. Need patient's new insurance information. Called patient to advise of current situation.  Will need patient's verbal ok to move forward and [facility] get [facility] info.

## 2014-08-19 NOTE — Telephone Encounter (Signed)
Pt is schedule at [facility] for 08/21/14 @ 8am, pt aware

## 2014-08-21 ENCOUNTER — Encounter: Payer: Self-pay | Admitting: Family Medicine

## 2014-08-21 ENCOUNTER — Ambulatory Visit (HOSPITAL_COMMUNITY)
Admission: RE | Admit: 2014-08-21 | Discharge: 2014-08-21 | Disposition: A | Discharge status: Home / Self Care | Payer: 59 | Source: Ambulatory Visit | Attending: Family Medicine | Admitting: Family Medicine

## 2014-08-21 ENCOUNTER — Encounter (HOSPITAL_COMMUNITY): Payer: Self-pay

## 2014-08-21 DIAGNOSIS — R012 Other cardiac sounds: Secondary | ICD-10-CM | POA: Diagnosis not present

## 2014-08-21 DIAGNOSIS — I495 Sick sinus syndrome: Secondary | ICD-10-CM

## 2014-08-21 SURGERY — Surgical Case
Anesthesia: *Unknown

## 2014-08-21 NOTE — H&P (Signed)
TOTAL HIP ADMISSION H&P  Patient is admitted for left total hip arthroplasty.  Subjective:  Chief Complaint: left hip pain  HPI: Hannah Davis, 44 y.o. female, has a history of pain and functional disability in the left hip(s) due to arthritis and patient has failed non-surgical conservative treatments for greater than 12 weeks to include NSAID's and/or analgesics, use of assistive devices and activity modification.  Onset of symptoms was gradual starting 3+ years ago with gradually worsening course since that time.The patient noted no past surgery on the left hip(s).  Patient currently rates pain in the left hip at 10 out of 10 with activity. Patient has night pain, worsening of pain with activity and weight bearing, trendelenberg gait, pain that interfers with activities of daily living and pain with passive range of motion. Patient has evidence of periarticular osteophytes and joint space narrowing by imaging studies. This condition presents safety issues increasing the risk of falls.  There is no current active infection.  Risks, benefits and expectations were discussed with the patient.  Risks including but not limited to the risk of anesthesia, blood clots, nerve damage, blood vessel damage, failure of the prosthesis, infection and up to and including death.  Patient understand the risks, benefits and expectations and wishes to proceed with surgery.   PCP: Danise EdgeBLYTH, STACEY, MD  D/C Plans:      Home with HHPT  Post-op Meds:       No Rx given  Tranexamic Acid:      To be given - IV   Decadron:      Is to be given  FYI:     ASA post-op  Norco post-op    Patient Active Problem List   Diagnosis Date Noted  . 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 BV (bacterial vaginosis) 12/09/2013  . Bradycardia 11/04/2013  . Candidiasis of vulva and vagina 12/03/2012  . Metrorrhagia 11/16/2012  . Elevated BP 06/05/2011  . Hyperlipidemia 06/05/2011  . Insomnia 06/05/2011  .  Preventative health care 06/05/2011  . History of menorrhagia 06/05/2011  . Chicken pox   . S/P gastric surgery   . Thyroid disorder   . PFO (patent foramen ovale)   . Hx of migraines    Past Medical History  Diagnosis Date  . Chicken pox as a child  . PFO (patent foramen ovale)   . Hyperlipidemia 06/05/2011  . Hip pain, left 06/05/2011  . Insomnia 06/05/2011  . S/P gastric surgery   . Bradycardia 11/04/2013  . 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 Family history of adverse reaction to anesthesia     mother had hard time waking up  . Borderline hypertension   . History of skin cancer   . History of kidney stones     Past Surgical History  Procedure Laterality Date  . Endometrial ablation  11-11  . Tubes tied  3-03  . Arthroscopy on right wrist  1997  . Bunionectomy  2005    left foot big toe  . Cesarean section  1-00  . Laparoscopic gastric sleeve resection  10/ 2013    No prescriptions prior to admission   Allergies  Allergen Reactions  . Codeine Hives  . Ibuprofen Nausea And Vomiting    History  Substance Use Topics  . Smoking status: Never Smoker   . Smokeless tobacco: Never Used  . Alcohol Use: Yes     Comment: a beer on the weekend    Family History  Problem Relation Age of Onset  . Cancer Mother 13    kidney/ removed kidney in remission  . Fibromyalgia Mother   . Asthma Mother   . Stroke Mother   . Heart attack Mother   . Arthritis Mother     rheumatoid  . Diabetes Father     type 2  . Heart disease Father   . Other Father     open heart bypass surgery/ smoked  . Other Sister     kidney problems  . Heart disease Brother   . Other Brother     open heart surgery  . Drug abuse Brother     cocaine  . Other Maternal Grandmother     brain tumor  . Heart attack Maternal Grandfather   . Heart disease Maternal Grandfather     MI  . Stroke Paternal Grandmother 25  . Other Paternal Grandfather     enlarged heart  . Obesity  Paternal Grandfather   . Stroke Brother     X 2     Review of Systems  Constitutional: Negative.   HENT: Negative.   Eyes: Negative.   Respiratory: Negative.   Cardiovascular: Negative.   Gastrointestinal: Negative.   Genitourinary: Negative.   Musculoskeletal: Positive for myalgias and joint pain.  Skin: Negative.   Neurological: Negative.   Endo/Heme/Allergies: Negative.   Psychiatric/Behavioral: The patient has insomnia.     Objective:  Physical Exam  Constitutional: She is oriented to person, place, and time. She appears well-developed and well-nourished.  HENT:  Head: Normocephalic.  Eyes: Pupils are equal, round, and reactive to light.  Neck: Neck supple. No JVD present. No tracheal deviation present. No thyromegaly present.  Cardiovascular: Normal rate, regular rhythm and intact distal pulses.   Murmur heard. Respiratory: Effort normal and breath sounds normal. No stridor. No respiratory distress. She has no wheezes.  GI: Soft. There is no tenderness. There is no guarding.  Musculoskeletal:       Left hip: She exhibits decreased range of motion, decreased strength, tenderness and bony tenderness. She exhibits no swelling, no deformity and no laceration.  Lymphadenopathy:    She has no cervical adenopathy.  Neurological: She is alert and oriented to person, place, and time.  Skin: Skin is warm and dry.  Psychiatric: She has a normal mood and affect.     Labs:  Estimated body mass index is 24.58 kg/(m^2) as calculated from the following:   Height as of 11/04/13:  (1.702 m).   Weight as of 12/09/13: 71.215 kg (157 lb).   Imaging Review Plain radiographs demonstrate severe degenerative joint disease of the left hip(s). The bone quality appears to be good for age and reported activity level.  Assessment/Plan:  End stage arthritis, left hip(s)  The patient history, physical examination, clinical judgement of the provider and imaging studies are consistent  with end stage degenerative joint disease of the left hip(s) and total hip arthroplasty is deemed medically necessary. The treatment options including medical management, injection therapy, arthroscopy and arthroplasty were discussed at length. The risks and benefits of total hip arthroplasty were presented and reviewed. The risks due to aseptic loosening, infection, stiffness, dislocation/subluxation,  thromboembolic complications and other imponderables were discussed.  The patient acknowledged the explanation, agreed to proceed with the plan and consent was signed. Patient is being admitted for inpatient treatment for surgery, pain control, PT, OT, prophylactic antibiotics, VTE prophylaxis, progressive ambulation and ADL's and discharge planning.The patient is planning to be discharged home  with home health services.    Anastasio Auerbach Kaytelyn Glore   PA-C  08/21/2014, 12:25 PM

## 2014-08-21 NOTE — Telephone Encounter (Signed)
Letter is printed please fax ASAP

## 2014-08-21 NOTE — Progress Notes (Signed)
Echocardiogram 2D Echocardiogram w/ bubble study has been performed.  Hannah Davis, Tambra Muller M 08/21/2014, 7:58 AM

## 2014-08-21 NOTE — Telephone Encounter (Signed)
Spoke with patient and advised per Bubble study results. Patient states that she needs a letter for surgical clearance.  [facility] and send one to patient.   Letter please and I will handle the rest.

## 2014-08-22 NOTE — Telephone Encounter (Signed)
[facility] and hard copy placed in mail for patient.

## 2014-08-26 ENCOUNTER — Encounter (HOSPITAL_COMMUNITY): Payer: Self-pay | Admitting: *Deleted

## 2014-08-26 ENCOUNTER — Inpatient Hospital Stay (HOSPITAL_COMMUNITY)
Admission: RE | Admit: 2014-08-26 | Discharge: 2014-08-27 | DRG: 470 | Disposition: A | Discharge status: Home Health | Payer: 59 | Source: Ambulatory Visit | Attending: Orthopedic Surgery | Admitting: Orthopedic Surgery

## 2014-08-26 ENCOUNTER — Inpatient Hospital Stay (HOSPITAL_COMMUNITY): Payer: 59

## 2014-08-26 ENCOUNTER — Encounter (HOSPITAL_COMMUNITY)
Admission: RE | Disposition: A | Discharge status: Home Health | Payer: Self-pay | Source: Ambulatory Visit | Attending: Orthopedic Surgery

## 2014-08-26 ENCOUNTER — Inpatient Hospital Stay (HOSPITAL_COMMUNITY): Payer: 59 | Admitting: Anesthesiology

## 2014-08-26 DIAGNOSIS — E785 Hyperlipidemia, unspecified: Secondary | ICD-10-CM | POA: Diagnosis present

## 2014-08-26 DIAGNOSIS — Z85828 Personal history of other malignant neoplasm of skin: Secondary | ICD-10-CM

## 2014-08-26 DIAGNOSIS — M1612 Unilateral primary osteoarthritis, left hip: Principal | ICD-10-CM | POA: Diagnosis present

## 2014-08-26 DIAGNOSIS — Z87442 Personal history of urinary calculi: Secondary | ICD-10-CM

## 2014-08-26 DIAGNOSIS — R339 Retention of urine, unspecified: Secondary | ICD-10-CM | POA: Diagnosis not present

## 2014-08-26 DIAGNOSIS — M25552 Pain in left hip: Secondary | ICD-10-CM | POA: Diagnosis present

## 2014-08-26 DIAGNOSIS — Z96649 Presence of unspecified artificial hip joint: Secondary | ICD-10-CM

## 2014-08-26 HISTORY — PX: TOTAL HIP ARTHROPLASTY: SHX124

## 2014-08-26 LAB — TYPE AND SCREEN
ABO/RH(D): O POS
Antibody Screen: NEGATIVE

## 2014-08-26 SURGERY — ARTHROPLASTY, HIP, TOTAL, ANTERIOR APPROACH
Anesthesia: Spinal | Laterality: Left

## 2014-08-26 MED ORDER — DEXAMETHASONE SODIUM PHOSPHATE 10 MG/ML IJ SOLN
INTRAMUSCULAR | Status: AC
  Filled 2014-08-26: qty 1

## 2014-08-26 MED ORDER — CEFAZOLIN SODIUM-DEXTROSE 2-3 GM-% IV SOLR
2.0000 g | Freq: Four times a day (QID) | INTRAVENOUS | Status: AC
  Administered 2014-08-26 (×2): 2 g via INTRAVENOUS
  Filled 2014-08-26 (×2): qty 50

## 2014-08-26 MED ORDER — MIDAZOLAM HCL 2 MG/2ML IJ SOLN
INTRAMUSCULAR | Status: AC
  Filled 2014-08-26: qty 2

## 2014-08-26 MED ORDER — PROPOFOL 10 MG/ML IV BOLUS
INTRAVENOUS | Status: AC
  Filled 2014-08-26: qty 20

## 2014-08-26 MED ORDER — EPHEDRINE SULFATE 50 MG/ML IJ SOLN
INTRAMUSCULAR | Status: AC
  Filled 2014-08-26: qty 1

## 2014-08-26 MED ORDER — SODIUM CHLORIDE 0.9 % IJ SOLN
INTRAMUSCULAR | Status: AC
  Filled 2014-08-26: qty 10

## 2014-08-26 MED ORDER — LACTATED RINGERS IV SOLN
INTRAVENOUS | Status: DC
  Administered 2014-08-26: 1000 mL via INTRAVENOUS

## 2014-08-26 MED ORDER — DOCUSATE SODIUM 100 MG PO CAPS
100.0000 mg | ORAL_CAPSULE | Freq: Two times a day (BID) | ORAL | Status: DC
  Administered 2014-08-26: 100 mg via ORAL

## 2014-08-26 MED ORDER — HYDROMORPHONE HCL 1 MG/ML IJ SOLN
0.2500 mg | INTRAMUSCULAR | Status: DC | PRN
Start: 2014-08-26 — End: 2014-08-26
  Administered 2014-08-26: 0.5 mg via INTRAVENOUS

## 2014-08-26 MED ORDER — ASPIRIN EC 325 MG PO TBEC
325.0000 mg | DELAYED_RELEASE_TABLET | Freq: Two times a day (BID) | ORAL | Status: DC
  Administered 2014-08-27: 325 mg via ORAL
  Filled 2014-08-26 (×3): qty 1

## 2014-08-26 MED ORDER — ONDANSETRON HCL 4 MG/2ML IJ SOLN
INTRAMUSCULAR | Status: DC | PRN
  Administered 2014-08-26: 4 mg via INTRAVENOUS

## 2014-08-26 MED ORDER — ONDANSETRON HCL 4 MG PO TABS: 4.0000 mg | ORAL_TABLET | Freq: Four times a day (QID) | ORAL | Status: DC | PRN

## 2014-08-26 MED ORDER — CEFAZOLIN SODIUM-DEXTROSE 2-3 GM-% IV SOLR
2.0000 g | INTRAVENOUS | Status: AC
  Administered 2014-08-26: 2 g via INTRAVENOUS

## 2014-08-26 MED ORDER — BISACODYL 10 MG RE SUPP: 10.0000 mg | Freq: Every day | RECTAL | Status: DC | PRN

## 2014-08-26 MED ORDER — FENTANYL CITRATE 0.05 MG/ML IJ SOLN
INTRAMUSCULAR | Status: DC | PRN
  Administered 2014-08-26 (×2): 50 ug via INTRAVENOUS

## 2014-08-26 MED ORDER — MENTHOL 3 MG MT LOZG: 1.0000 | LOZENGE | OROMUCOSAL | Status: DC | PRN

## 2014-08-26 MED ORDER — POLYETHYLENE GLYCOL 3350 17 G PO PACK: 17.0000 g | PACK | Freq: Two times a day (BID) | ORAL | Status: DC

## 2014-08-26 MED ORDER — CELECOXIB 200 MG PO CAPS
200.0000 mg | ORAL_CAPSULE | Freq: Two times a day (BID) | ORAL | Status: DC
  Administered 2014-08-26: 200 mg via ORAL
  Filled 2014-08-26 (×3): qty 1

## 2014-08-26 MED ORDER — ONDANSETRON HCL 4 MG/2ML IJ SOLN
INTRAMUSCULAR | Status: AC
  Filled 2014-08-26: qty 2

## 2014-08-26 MED ORDER — MIDAZOLAM HCL 5 MG/5ML IJ SOLN
INTRAMUSCULAR | Status: DC | PRN
  Administered 2014-08-26: 2 mg via INTRAVENOUS

## 2014-08-26 MED ORDER — SODIUM CHLORIDE 0.9 % IR SOLN
Status: DC | PRN
  Administered 2014-08-26: 1000 mL

## 2014-08-26 MED ORDER — METHOCARBAMOL 1000 MG/10ML IJ SOLN
500.0000 mg | Freq: Four times a day (QID) | INTRAVENOUS | Status: DC | PRN
  Administered 2014-08-26: 500 mg via INTRAVENOUS
  Filled 2014-08-26 (×2): qty 5

## 2014-08-26 MED ORDER — CHLORHEXIDINE GLUCONATE 4 % EX LIQD: 60.0000 mL | Freq: Once | CUTANEOUS | Status: DC

## 2014-08-26 MED ORDER — SODIUM CHLORIDE 0.9 % IV SOLN
100.0000 mL/h | INTRAVENOUS | Status: DC
  Administered 2014-08-26 – 2014-08-27 (×2): 100 mL/h via INTRAVENOUS
  Filled 2014-08-26 (×5): qty 1000

## 2014-08-26 MED ORDER — DEXAMETHASONE SODIUM PHOSPHATE 10 MG/ML IJ SOLN
10.0000 mg | Freq: Once | INTRAMUSCULAR | Status: AC
  Administered 2014-08-26: 10 mg via INTRAVENOUS

## 2014-08-26 MED ORDER — PHENYLEPHRINE 40 MCG/ML (10ML) SYRINGE FOR IV PUSH (FOR BLOOD PRESSURE SUPPORT)
PREFILLED_SYRINGE | INTRAVENOUS | Status: AC
  Filled 2014-08-26: qty 10

## 2014-08-26 MED ORDER — METOCLOPRAMIDE HCL 10 MG PO TABS: 5.0000 mg | ORAL_TABLET | Freq: Three times a day (TID) | ORAL | Status: DC | PRN

## 2014-08-26 MED ORDER — GLYCOPYRROLATE 0.2 MG/ML IJ SOLN
INTRAMUSCULAR | Status: AC
  Filled 2014-08-26: qty 1

## 2014-08-26 MED ORDER — HYDROMORPHONE HCL 1 MG/ML IJ SOLN
INTRAMUSCULAR | Status: AC
  Administered 2014-08-26: 1 mg
  Filled 2014-08-26: qty 1

## 2014-08-26 MED ORDER — LACTATED RINGERS IV SOLN: INTRAVENOUS | Status: DC

## 2014-08-26 MED ORDER — LIDOCAINE HCL (CARDIAC) 20 MG/ML IV SOLN
INTRAVENOUS | Status: DC | PRN
  Administered 2014-08-26: 50 mg via INTRAVENOUS

## 2014-08-26 MED ORDER — GLYCOPYRROLATE 0.2 MG/ML IJ SOLN
INTRAMUSCULAR | Status: DC | PRN
  Administered 2014-08-26: 0.2 mg via INTRAVENOUS

## 2014-08-26 MED ORDER — PROPOFOL 10 MG/ML IV EMUL
INTRAVENOUS | Status: DC | PRN
  Administered 2014-08-26: 20 mg via INTRAVENOUS

## 2014-08-26 MED ORDER — BUPIVACAINE HCL (PF) 0.5 % IJ SOLN
INTRAMUSCULAR | Status: DC | PRN
  Administered 2014-08-26: 3 mL

## 2014-08-26 MED ORDER — METHOCARBAMOL 500 MG PO TABS: 500.0000 mg | ORAL_TABLET | Freq: Four times a day (QID) | ORAL | Status: DC | PRN

## 2014-08-26 MED ORDER — CEFAZOLIN SODIUM-DEXTROSE 2-3 GM-% IV SOLR
INTRAVENOUS | Status: AC
  Filled 2014-08-26: qty 50

## 2014-08-26 MED ORDER — MAGNESIUM CITRATE PO SOLN: 1.0000 | Freq: Once | ORAL | Status: AC | PRN

## 2014-08-26 MED ORDER — ONDANSETRON HCL 4 MG/2ML IJ SOLN
4.0000 mg | Freq: Four times a day (QID) | INTRAMUSCULAR | Status: DC | PRN
  Administered 2014-08-27: 4 mg via INTRAVENOUS
  Filled 2014-08-26: qty 2

## 2014-08-26 MED ORDER — PHENYLEPHRINE HCL 10 MG/ML IJ SOLN
INTRAMUSCULAR | Status: DC | PRN
  Administered 2014-08-26 (×3): 40 ug via INTRAVENOUS

## 2014-08-26 MED ORDER — LACTATED RINGERS IV SOLN
INTRAVENOUS | Status: DC | PRN
  Administered 2014-08-26 (×2): via INTRAVENOUS

## 2014-08-26 MED ORDER — ONDANSETRON HCL 4 MG/2ML IJ SOLN
INTRAMUSCULAR | Status: AC
  Filled 2014-08-26: qty 4

## 2014-08-26 MED ORDER — FERROUS SULFATE 325 (65 FE) MG PO TABS
325.0000 mg | ORAL_TABLET | Freq: Three times a day (TID) | ORAL | Status: DC
  Administered 2014-08-26: 325 mg via ORAL
  Filled 2014-08-26 (×5): qty 1

## 2014-08-26 MED ORDER — HYDROCODONE-ACETAMINOPHEN 7.5-325 MG PO TABS
1.0000 | ORAL_TABLET | ORAL | Status: DC
  Administered 2014-08-26 – 2014-08-27 (×5): 2 via ORAL
  Filled 2014-08-26 (×5): qty 2

## 2014-08-26 MED ORDER — DEXAMETHASONE SODIUM PHOSPHATE 10 MG/ML IJ SOLN
10.0000 mg | Freq: Once | INTRAMUSCULAR | Status: AC
  Administered 2014-08-27: 10 mg via INTRAVENOUS
  Filled 2014-08-26: qty 1

## 2014-08-26 MED ORDER — FENTANYL CITRATE 0.05 MG/ML IJ SOLN
INTRAMUSCULAR | Status: AC
  Filled 2014-08-26: qty 5

## 2014-08-26 MED ORDER — PROPOFOL INFUSION 10 MG/ML OPTIME
INTRAVENOUS | Status: DC | PRN
  Administered 2014-08-26: 100 ug/kg/min via INTRAVENOUS

## 2014-08-26 MED ORDER — HYDROMORPHONE HCL 1 MG/ML IJ SOLN
0.5000 mg | INTRAMUSCULAR | Status: DC | PRN
  Administered 2014-08-26 (×3): 1 mg via INTRAVENOUS
  Administered 2014-08-27: 0.5 mg via INTRAVENOUS
  Filled 2014-08-26 (×5): qty 1

## 2014-08-26 MED ORDER — PHENOL 1.4 % MT LIQD: 1.0000 | OROMUCOSAL | Status: DC | PRN

## 2014-08-26 MED ORDER — DIPHENHYDRAMINE HCL 25 MG PO CAPS: 25.0000 mg | ORAL_CAPSULE | Freq: Four times a day (QID) | ORAL | Status: DC | PRN

## 2014-08-26 MED ORDER — ALUM & MAG HYDROXIDE-SIMETH 200-200-20 MG/5ML PO SUSP
30.0000 mL | ORAL | Status: DC | PRN
Start: 2014-08-26 — End: 2014-08-27

## 2014-08-26 MED ORDER — TRANEXAMIC ACID 100 MG/ML IV SOLN
1000.0000 mg | Freq: Once | INTRAVENOUS | Status: AC
  Administered 2014-08-26: 1000 mg via INTRAVENOUS
  Filled 2014-08-26: qty 10

## 2014-08-26 MED ORDER — METOCLOPRAMIDE HCL 5 MG/ML IJ SOLN: 5.0000 mg | Freq: Three times a day (TID) | INTRAMUSCULAR | Status: DC | PRN

## 2014-08-26 SURGICAL SUPPLY — 39 items
ADH SKN CLS APL DERMABOND .7 (GAUZE/BANDAGES/DRESSINGS) ×1
BAG ZIPLOCK 12X15 (MISCELLANEOUS) IMPLANT
CAPT HIP TOTAL 3 ×3 IMPLANT
COVER PERINEAL POST (MISCELLANEOUS) ×3 IMPLANT
DERMABOND ADVANCED (GAUZE/BANDAGES/DRESSINGS) ×2
DERMABOND ADVANCED .7 DNX12 (GAUZE/BANDAGES/DRESSINGS) ×1 IMPLANT
DRAPE C-ARM 42X120 X-RAY (DRAPES) ×3 IMPLANT
DRAPE STERI IOBAN 125X83 (DRAPES) ×3 IMPLANT
DRAPE U-SHAPE 47X51 STRL (DRAPES) ×9 IMPLANT
DRSG AQUACEL AG ADV 3.5X10 (GAUZE/BANDAGES/DRESSINGS) ×3 IMPLANT
DURAPREP 26ML APPLICATOR (WOUND CARE) ×3 IMPLANT
ELECT BLADE TIP CTD 4 INCH (ELECTRODE) ×3 IMPLANT
ELECT PENCIL ROCKER SW 15FT (MISCELLANEOUS) IMPLANT
ELECT REM PT RETURN 15FT ADLT (MISCELLANEOUS) IMPLANT
ELECT REM PT RETURN 9FT ADLT (ELECTROSURGICAL) ×3
ELECTRODE REM PT RTRN 9FT ADLT (ELECTROSURGICAL) ×1 IMPLANT
FACESHIELD WRAPAROUND (MASK) ×12 IMPLANT
GLOVE BIOGEL PI IND STRL 7.5 (GLOVE) ×1 IMPLANT
GLOVE BIOGEL PI IND STRL 8.5 (GLOVE) ×1 IMPLANT
GLOVE BIOGEL PI INDICATOR 7.5 (GLOVE) ×2
GLOVE BIOGEL PI INDICATOR 8.5 (GLOVE) ×2
GLOVE ECLIPSE 8.0 STRL XLNG CF (GLOVE) ×6 IMPLANT
GLOVE ORTHO TXT STRL SZ7.5 (GLOVE) ×3 IMPLANT
GOWN SPEC L3 XXLG W/TWL (GOWN DISPOSABLE) ×3 IMPLANT
GOWN STRL REUS W/TWL LRG LVL3 (GOWN DISPOSABLE) ×3 IMPLANT
HOLDER FOLEY CATH W/STRAP (MISCELLANEOUS) IMPLANT
KIT BASIN OR (CUSTOM PROCEDURE TRAY) ×3 IMPLANT
LIQUID BAND (GAUZE/BANDAGES/DRESSINGS) ×3 IMPLANT
PACK TOTAL JOINT (CUSTOM PROCEDURE TRAY) ×3 IMPLANT
SAW OSC TIP CART 19.5X105X1.3 (SAW) ×3 IMPLANT
SUT MNCRL AB 4-0 PS2 18 (SUTURE) ×3 IMPLANT
SUT VIC AB 1 CT1 36 (SUTURE) ×9 IMPLANT
SUT VIC AB 2-0 CT1 27 (SUTURE) ×6
SUT VIC AB 2-0 CT1 TAPERPNT 27 (SUTURE) ×2 IMPLANT
SUT VLOC 180 0 24IN GS25 (SUTURE) ×3 IMPLANT
TOWEL OR 17X26 10 PK STRL BLUE (TOWEL DISPOSABLE) ×3 IMPLANT
TOWEL OR NON WOVEN STRL DISP B (DISPOSABLE) IMPLANT
TRAY FOLEY CATH 14FRSI W/METER (CATHETERS) IMPLANT
WATER STERILE IRR 1500ML POUR (IV SOLUTION) ×3 IMPLANT

## 2014-08-26 NOTE — Progress Notes (Signed)
X-ray results noted 

## 2014-08-26 NOTE — Op Note (Signed)
NAME:  Hannah Davis                ACCOUNT NO.: [id number]      MEDICAL RECORD NO.: [id number]      FACILITY:  [facility]      PHYSICIAN:  Durene Romans D  DATE OF BIRTH:  [birth year]     DATE OF PROCEDURE:  08/26/2014                                 OPERATIVE REPORT         PREOPERATIVE DIAGNOSIS: Left  hip osteoarthritis.      POSTOPERATIVE DIAGNOSIS:  Left hip osteoarthritis.      PROCEDURE:  Left total hip replacement through an anterior approach   utilizing DePuy THR system, component size 52mm pinnacle cup, a size 36 neutral   Ceramax ceramic  liner, a size 5 standard Tri Lock stem with a 36+5 delta ceramic   ball.      SURGEON:  Madlyn Frankel. Charlann Boxer, M.D.      ASSISTANT:  Lanney Gins, PA-C      ANESTHESIA:  Spinal.      SPECIMENS:  None.      COMPLICATIONS:  None.      BLOOD LOSS:  250 cc     DRAINS:  none.      INDICATION OF THE PROCEDURE:  Hannah Davis is a 44 y.o. female who had   presented to office for evaluation of left hip pain.  Radiographs revealed   progressive degenerative changes with bone-on-bone   articulation to the  hip joint.  The patient had painful limited range of   motion significantly affecting their overall quality of life.  The patient was failing to    respond to conservative measures, and at this point was ready   to proceed with more definitive measures.  The patient has noted progressive   degenerative changes in his hip, progressive problems and dysfunction   with regarding the hip prior to surgery.  Consent was obtained for   benefit of pain relief.  Specific risk of infection, DVT, component   failure, dislocation, need for revision surgery, as well discussion of   the anterior versus posterior approach were reviewed.  Consent was   obtained for benefit of anterior pain relief through an anterior   approach.      PROCEDURE IN DETAIL:  The patient was brought to operative theater.   Once adequate  anesthesia, preoperative antibiotics, 2gm of Ancef, 1 gm of Tranexamic Acid, and  of Decadron administered.   The patient was positioned supine on the OSI Hanna table.  Once adequate   padding of boney process was carried out, we had predraped out the hip, and  used fluoroscopy to confirm orientation of the pelvis and position.      The left hip was then prepped and draped from proximal iliac crest to   mid thigh with shower curtain technique.      Time-out was performed identifying the patient, planned procedure, and   extremity.     An incision was then made 2 cm distal and lateral to the   anterior superior iliac spine extending over the orientation of the   tensor fascia lata muscle and sharp dissection was carried down to the   fascia of the muscle and protractor placed in the soft tissues.      The  fascia was then incised.  The muscle belly was identified and swept   laterally and retractor placed along the superior neck.  Following   cauterization of the circumflex vessels and removing some pericapsular   fat, a second cobra retractor was placed on the inferior neck.  A third   retractor was placed on the anterior acetabulum after elevating the   anterior rectus.  A L-capsulotomy was along the line of the   superior neck to the trochanteric fossa, then extended proximally and   distally.  Tag sutures were placed and the retractors were then placed   intracapsular.  We then identified the trochanteric fossa and   orientation of my neck cut, confirmed this radiographically   and then made a neck osteotomy with the femur on traction.  The femoral   head was removed without difficulty or complication.  Traction was let   off and retractors were placed posterior and anterior around the   acetabulum.      The labrum and foveal tissue were debrided.  I began reaming with a 47mm   reamer and reamed up to 51mm reamer with good bony bed preparation and a 52mm   cup was chosen.  The  final 52mm Pinnacle cup was then impacted under fluoroscopy  to confirm the depth of penetration and orientation with respect to   abduction.  A screw was placed followed by the hole eliminator.  The final   36mm neutral Ceramax ceramic liner was impacted with good visualized rim fit.  The cup was positioned anatomically within the acetabular portion of the pelvis.      At this point, the femur was rolled at 80 degrees.  Further capsule was   released off the inferior aspect of the femoral neck.  I then   released the superior capsule proximally.  The hook was placed laterally   along the femur and elevated manually and held in position with the bed   hook.  The leg was then extended and adducted with the leg rolled to 100   degrees of external rotation.  Once the proximal femur was fully   exposed, I used a box osteotome to set orientation.  I then began   broaching with the starting chili pepper broach and passed this by hand and then broached up to 5.  With the 5 broach in place I chose a standard neck and did a trial reductions first with the +1.5 then the +5.  The offset was appropriate, leg lengths   appeared to be equal, confirmed radiographically.  This seemed to be the best combination to reproduce her left hip anatomy  Given these findings, I went ahead and dislocated the hip, repositioned all   retractors and positioned the right hip in the extended and abducted position.  The final 5 Standard Tri Lock stem was   chosen and it was impacted down to the level of neck cut.  Based on this   and the trial reduction, a 36+5 delta ceramic ball was chosen and   impacted onto a clean and dry trunnion, and the hip was reduced.  The   hip had been irrigated throughout the case again at this point.  I did   reapproximate the superior capsular leaflet to the anterior leaflet   using #1 Vicryl.  The fascia of the   tensor fascia lata muscle was then reapproximated using #1 Vicryl and #0 V-lock  sutures.  The   remaining wound was closed  with 2-0 Vicryl and running 4-0 Monocryl.   The hip was cleaned, dried, and dressed sterilely using Dermabond and   Aquacel dressing.  She was then brought   to recovery room in stable condition tolerating the procedure well.    Lanney GinsMatthew Babish, PA-C was present for the entirety of the case involved from   preoperative positioning, perioperative retractor management, general   facilitation of the case, as well as primary wound closure as assistant.            Madlyn FrankelMatthew D. Charlann Boxerlin, M.D.        08/26/2014 11:07 AM

## 2014-08-26 NOTE — Progress Notes (Signed)
Dr. Leta JunglingEwell made aware of patient's spinal levels- O.K. To go to floor

## 2014-08-26 NOTE — Progress Notes (Signed)
Portable AP Pelvis and Lateral Left Hip X-rays done. 

## 2014-08-26 NOTE — Anesthesia Postprocedure Evaluation (Signed)
  Anesthesia Post-op Note  Patient: Hannah Davis  Procedure(s) Performed: Procedure(s) (LRB): LEFT TOTAL HIP ARTHROPLASTY ANTERIOR APPROACH (Left)  Patient Location: PACU  Anesthesia Type: Spinal  Level of Consciousness: awake and alert   Airway and Oxygen Therapy: Patient Spontanous Breathing  Post-op Pain: mild  Post-op Assessment: Post-op Vital signs reviewed, Patient's Cardiovascular Status Stable, Respiratory Function Stable, Patent Airway and No signs of Nausea or vomiting  Last Vitals:  Filed Vitals:   08/26/14 1523  BP: 122/65  Pulse: 55  Temp: 36.8 C  Resp: 14    Post-op Vital Signs: stable   Complications: No apparent anesthesia complications

## 2014-08-26 NOTE — Transfer of Care (Signed)
Immediate Anesthesia Transfer of Care Note Immediate Anesthesia Transfer of Care Note  Patient: Hannah Davis  Procedure(s) Performed: Procedure(s) (LRB): LEFT TOTAL HIP ARTHROPLASTY ANTERIOR APPROACH (Left)  Patient Location: PACU  Anesthesia Type: Spinal  Level of Consciousness: sedated, patient cooperative and responds to stimulation  Airway & Oxygen Therapy: Patient Spontanous Breathing and Patient connected to face mask oxgen  Post-op Assessment: Report given to PACU RN and Post -op Vital signs reviewed and stable  Post vital signs: Reviewed and stable  Complications: No apparent anesthesia complications

## 2014-08-26 NOTE — Interval H&P Note (Signed)
History and Physical Interval Note:  08/26/2014 7:20 AM  Hannah Davis  has presented today for surgery, with the diagnosis of LEFT HIP OA  The various methods of treatment have been discussed with the patient and family. After consideration of risks, benefits and other options for treatment, the patient has consented to  Procedure(s): LEFT TOTAL HIP ARTHROPLASTY ANTERIOR APPROACH (Left) as a surgical intervention .  The patient's history has been reviewed, patient examined, no change in status, stable for surgery.  I have reviewed the patient's chart and labs.  Questions were answered to the patient's satisfaction.     Shelda PalLIN,Keyton Bhat D

## 2014-08-26 NOTE — Anesthesia Preprocedure Evaluation (Addendum)
Anesthesia Evaluation  Patient identified by MRN, date of birth, ID band Patient awake    Reviewed: Allergy & Precautions, H&P , NPO status , Patient's Chart, lab work & pertinent test results  Airway Mallampati: II  TM Distance: >3 FB Neck ROM: full    Dental  (+) Dental Advisory Given, Caps Right upper front bonded:   Pulmonary neg pulmonary ROS,  breath sounds clear to auscultation  Pulmonary exam normal       Cardiovascular Exercise Tolerance: Good Rhythm:regular Rate:Normal  PFO. Bradycardia. Borderline htn   Neuro/Psych negative neurological ROS  negative psych ROS   GI/Hepatic negative GI ROS, Neg liver ROS,   Endo/Other  negative endocrine ROSThyroid disorder  Renal/GU negative Renal ROS  negative genitourinary   Musculoskeletal   Abdominal   Peds  Hematology negative hematology ROS (+)   Anesthesia Other Findings   Reproductive/Obstetrics negative OB ROS                            Anesthesia Physical Anesthesia Plan  ASA: II  Anesthesia Plan: Spinal   Post-op Pain Management:    Induction:   Airway Management Planned:   Additional Equipment:   Intra-op Plan:   Post-operative Plan:   Informed Consent: I have reviewed the patients History and Physical, chart, labs and discussed the procedure including the risks, benefits and alternatives for the proposed anesthesia with the patient or authorized representative who has indicated his/her understanding and acceptance.   Dental Advisory Given  Plan Discussed with: CRNA and Surgeon  Anesthesia Plan Comments:         Anesthesia Quick Evaluation

## 2014-08-26 NOTE — Anesthesia Procedure Notes (Signed)
Spinal Patient location during procedure: OR Start time: 08/26/2014 9:38 AM End time: 08/26/2014 9:48 AM Staffing Anesthesiologist: Gaetano HawthorneEWELL, CHARLES L Resident/CRNA: Lyda KalataJARVELA, JOSHUA R Performed by: resident/CRNA  Preanesthetic Checklist Completed: patient identified, site marked, surgical consent, pre-op evaluation, timeout performed, IV checked, risks and benefits discussed and monitors and equipment checked Spinal Block Patient position: sitting Prep: Betadine Patient monitoring: heart rate, cardiac monitor, continuous pulse ox and blood pressure Location: L3-4 Injection technique: single-shot Needle Needle type: Spinocan  Needle gauge: 24 G Needle length: 9 cm Needle insertion depth: 6.5 cm Assessment Sensory level: T6

## 2014-08-27 LAB — BASIC METABOLIC PANEL
Anion gap: 5 (ref 5–15)
BUN: 9 mg/dL (ref 6–23)
CALCIUM: 8.1 mg/dL — AB (ref 8.4–10.5)
CO2: 25 mmol/L (ref 19–32)
CREATININE: 0.72 mg/dL (ref 0.50–1.10)
Chloride: 107 mEq/L (ref 96–112)
GFR calc Af Amer: >90 mL/min (ref >90)
GFR calc non Af Amer: >90 mL/min (ref >90)
Glucose, Bld: 130 mg/dL — ABNORMAL HIGH (ref 70–99)
Potassium: 3.9 mmol/L (ref 3.5–5.1)
SODIUM: 137 mmol/L (ref 135–145)

## 2014-08-27 LAB — CBC
HCT: 30.6 % — ABNORMAL LOW (ref 36.0–46.0)
Hemoglobin: 10.3 g/dL — ABNORMAL LOW (ref 12.0–15.0)
MCH: 30.9 pg (ref 26.0–34.0)
MCHC: 33.7 g/dL (ref 30.0–36.0)
MCV: 91.9 fL (ref 78.0–100.0)
Platelets: 205 10*3/uL (ref 150–400)
RBC: 3.33 MIL/uL — ABNORMAL LOW (ref 3.87–5.11)
RDW: 12.1 % (ref 11.5–15.5)
WBC: 10.1 10*3/uL (ref 4.0–10.5)

## 2014-08-27 MED ORDER — DSS 100 MG PO CAPS: 100.0000 mg | ORAL_CAPSULE | Freq: Two times a day (BID) | ORAL | Status: DC

## 2014-08-27 MED ORDER — FERROUS SULFATE 325 (65 FE) MG PO TABS: 325.0000 mg | ORAL_TABLET | Freq: Three times a day (TID) | ORAL | Status: DC

## 2014-08-27 MED ORDER — HYDROCODONE-ACETAMINOPHEN 7.5-325 MG PO TABS: 1.0000 | ORAL_TABLET | ORAL | Status: DC | PRN

## 2014-08-27 MED ORDER — ASPIRIN 325 MG PO TBEC
325.0000 mg | DELAYED_RELEASE_TABLET | Freq: Two times a day (BID) | ORAL | Status: AC
Start: 2014-08-27 — End: 2014-09-24

## 2014-08-27 MED ORDER — POLYETHYLENE GLYCOL 3350 17 G PO PACK: 17.0000 g | PACK | Freq: Two times a day (BID) | ORAL | Status: DC

## 2014-08-27 MED ORDER — METHOCARBAMOL 500 MG PO TABS: 500.0000 mg | ORAL_TABLET | Freq: Four times a day (QID) | ORAL | Status: DC | PRN

## 2014-08-27 NOTE — Progress Notes (Signed)
Patient witnessed by Pauline DaTamera Reasonncy NT to be taking meds from family member. Pt. States its excedrin for her headache, counseled patient on not taking own meds, she stated nothing you gave me helped and im not arguing with you.  Sharrell Ku Grayling Schranz RN

## 2014-08-27 NOTE — Evaluation (Signed)
Occupational Therapy Evaluation Patient Details Name: Hannah Davis MRN: [id number] DOB: 1971/07/19 Today's Date: 08/27/2014    History of Present Illness L THR   Clinical Impression   This 44 year old female was admitted for the above surgery.  All education was completed. No further OT is needed at this time.    Follow Up Recommendations  Supervision/Assistance - 24 hour    Equipment Recommendations  3 in 1 bedside comode (delivered)    Recommendations for Other Services       Precautions / Restrictions Precautions Precautions: Fall Restrictions Other Position/Activity Restrictions: WBAT      Mobility Bed Mobility         Supine to sit: Min assist Sit to supine: Min assist   General bed mobility comments: assist for LLE.  Educated on Research officer, political partyself-assisting  Transfers   Equipment used: Agricultural consultantolling walker (2 wheeled) Transfers: Sit to/from Merrill LynchStand Sit to Stand: Min assist;Min guard         General transfer comment: Min A from bed; min guard from 3:1    Balance                                            ADL Overall ADL's : Needs assistance/impaired             Lower Body Bathing: Minimal assistance;Sit to/from stand       Lower Body Dressing: Minimal assistance;Sit to/from stand   Toilet Transfer: Min guard;Ambulation;BSC   Toileting- ArchitectClothing Manipulation and Hygiene: Min guard;Sit to/from stand         General ADL Comments: Pt ambulated to bathroom with min guard and used commode.  She has a tub shower at home and her mother has a tub seat she can borrow.  Educated on tub readiness; HHPT can see if she can back up onto seat safely     Vision                     Perception     Praxis      Pertinent Vitals/Pain Pain Score: 8  Pain Location: L hip Pain Descriptors / Indicators: Aching Pain Intervention(s): Limited activity within patient's tolerance;Monitored during session;Repositioned;Ice applied;Patient  requesting pain meds-RN notified     Hand Dominance     Extremity/Trunk Assessment Upper Extremity Assessment Upper Extremity Assessment: Overall WFL for tasks assessed           Communication Communication Communication: No difficulties   Cognition Arousal/Alertness: Awake/alert Behavior During Therapy: WFL for tasks assessed/performed Overall Cognitive Status: Within Functional Limits for tasks assessed                     General Comments       Exercises       Shoulder Instructions      Home Living Family/patient expects to be discharged to:: Private residence Living Arrangements: Spouse/significant other Available Help at Discharge: Family Type of Home: Apartment Home Access: Stairs to enter Secretary/administratorntrance Stairs-Number of Steps: 4 Entrance Stairs-Rails: Right Home Layout: One level     Bathroom Shower/Tub: Tub/shower unit Shower/tub characteristics: Engineer, building servicesCurtain Bathroom Toilet: Standard     Home Equipment: None   Additional Comments: 3;1 was delivered to room      Prior Functioning/Environment Level of Independence: Independent             OT Diagnosis: Generalized  weakness   OT Problem List:     OT Treatment/Interventions:      OT Goals(Current goals can be found in the care plan section) Acute Rehab OT Goals Patient Stated Goal: Resume previous lifestyle with decreased pain  OT Frequency:     Barriers to D/C:            Co-evaluation              End of Session Nurse Communication: Patient requests pain meds  Activity Tolerance: Patient tolerated treatment well Patient left: in bed;with call bell/phone within reach;with family/visitor present   Time: 1610-9604 OT Time Calculation (min): 14 min Charges:  OT General Charges $OT Visit: 1 Procedure OT Evaluation $Initial OT Evaluation Tier I: 1 Procedure OT Treatments $Self Care/Home Management : 8-22 mins G-Codes:    Hannah Davis 09-26-14, 2:16 PM   Hannah Davis, OTR/L [phone] 2014/09/26

## 2014-08-27 NOTE — Care Management Note (Signed)
    Page 1 of 2   08/27/2014     10:27:07 AM CARE MANAGEMENT NOTE 08/27/2014  Patient:  Hannah Davis, Hannah Davis   Account Number:  [id number]  Date Initiated:  08/27/2014  Documentation initiated by:  [facility]  Subjective/Objective Assessment:   adm: LEFT TOTAL HIP ARTHROPLASTY ANTERIOR APPROACH (Left)     Action/Plan:   discharge planning   Anticipated DC Date:  08/27/2014   Anticipated DC Plan:  Friend  CM consult      [facility] Choice  HOME HEALTH   Choice offered to / List presented to:  C-1 Patient   DME arranged  3-N-1  Vassie Moselle      DME agency  [place] arranged  [place].   Status of service:  Completed, signed off Medicare Important Message given?   (If response is "NO", the following Medicare IM given date fields will be blank) Date Medicare IM given:   Medicare IM given by:   Date Additional Medicare IM given:   Additional Medicare IM given by:    Discharge Disposition:  [place]  Per UR Regulation:  Reviewed for med. necessity/level of care/duration of stay  If discussed at [place] of Stay Meetings, dates discussed:    Comments:  08/27/14 08:45 CM met with pt in room to offer choice of home health agency.  Pt chooses [facility]  to render HHPT. Address and contact information verified by pt.  CM called [facility] DME rep, Lecretia to please deliver 3n1 and rolling walker to room prior to discharge. Referral called to [facility] rep, Kristen.  No other CM needs were communicated.  Mariane Masters, BSN, CM [phone].

## 2014-08-27 NOTE — Progress Notes (Signed)
Pt would like to wait until PT evaluation to have Foley cath removed; will pass along to day shift RN

## 2014-08-27 NOTE — Progress Notes (Signed)
     Subjective: 1 Day Post-Op Procedure(s) (LRB): LEFT TOTAL HIP ARTHROPLASTY ANTERIOR APPROACH (Left)   Patient reports pain as mild, pain controlled. Apparently her pain was a little out of control last night, but better today. She is a little nervous about the hip and PT.  Issues with urinary retention last night and a foley was placed. Ready to be discharged home if she does well with PT and pain stays controlled. If any issues we will see her tomorrow.  Objective:   VITALS:   Filed Vitals:   08/27/14 0958  BP: 112/52  Pulse: 58  Temp: 98.2 F (36.8 C)  Resp: 16    Dorsiflexion/Plantar flexion intact Incision: dressing C/D/I No cellulitis present Compartment soft  LABS  Recent Labs  08/27/14 0416  HGB 10.3*  HCT 30.6*  WBC 10.1  PLT 205     Recent Labs  08/27/14 0416  NA 137  K 3.9  BUN 9  CREATININE 0.72  GLUCOSE 130*     Assessment/Plan: 1 Day Post-Op Procedure(s) (LRB): LEFT TOTAL HIP ARTHROPLASTY ANTERIOR APPROACH (Left) Foley cath d/c'ed Advance diet Up with therapy D/C IV fluids Discharge home with home health  [facility]. Follow up with OLIN,Kamla Skilton D in 2 weeks.  Contact information:  [facility] [address] [place] [id number] [phone]        Anastasio AuerbachMatthew S. Leanndra Pember   PAC  08/27/2014, 9:59 AM

## 2014-08-27 NOTE — Progress Notes (Signed)
OT Cancellation Note  Patient Details Name: Hannah Davis MRN: [id number] DOB: 24-Oct-1970   Cancelled Treatment:    Reason Eval/Treat Not Completed: Other (comment).  Pt has nausea and headache.  Will check back later.  [place]aks MillMaryellen Lora Chavers, OTR/L [phone] 08/27/2014  Hannah Davis 08/27/2014, 10:40 AM

## 2014-08-27 NOTE — Progress Notes (Signed)
Physical Therapy Treatment Patient Details Name: Hannah Davis MRN: [id number] DOB: 1971/03/08 Today's Date: 08/27/2014    History of Present Illness L THR    PT Comments    Pt motivated and progressing well.    Follow Up Recommendations  Home health PT     Equipment Recommendations  Rolling walker with 5" wheels    Recommendations for Other Services OT consult     Precautions / Restrictions Precautions Precautions: Fall Restrictions Weight Bearing Restrictions: No Other Position/Activity Restrictions: WBAT    Mobility  Bed Mobility         Supine to sit: Min assist Sit to supine: Min assist   General bed mobility comments: assist for LLE.  Educated on self-assisting  Transfers Overall transfer level: Needs assistance Equipment used: Rolling walker (2 wheeled) Transfers: Sit to/from Stand Sit to Stand: Supervision         General transfer comment: Min A from bed; min guard from 3:1  Ambulation/Gait Ambulation/Gait assistance: Supervision Ambulation Distance (Feet): 200 Feet Assistive device: Rolling walker (2 wheeled) Gait Pattern/deviations: Step-through pattern;Decreased step length - right;Decreased step length - left;Shuffle;Trunk flexed Gait velocity: decr   General Gait Details: min cues for posture and position from RW   Stairs Stairs: Yes Stairs assistance: Min guard Stair Management: One rail Right;Forwards;With crutches;Step to pattern Number of Stairs: 4 General stair comments: cues for sequence and foot/crutch placement  Wheelchair Mobility    Modified Rankin (Stroke Patients Only)       Balance                                    Cognition Arousal/Alertness: Awake/alert Behavior During Therapy: WFL for tasks assessed/performed Overall Cognitive Status: Within Functional Limits for tasks assessed                      Exercises      General Comments        Pertinent Vitals/Pain Pain  Assessment: 0-10 Pain Score: 4  Pain Location: L hip Pain Descriptors / Indicators: Aching;Burning Pain Intervention(s): Limited activity within patient's tolerance;Monitored during session;Premedicated before session;Ice applied    Home Living Family/patient expects to be discharged to:: Private residence Living Arrangements: Spouse/significant other Available Help at Discharge: Family Type of Home: Apartment Home Access: Stairs to enter Entrance Stairs-Rails: Right Home Layout: One level Home Equipment: None Additional Comments: 3;1 was delivered to room    Prior Function Level of Independence: Independent          PT Goals (current goals can now be found in the care plan section) Acute Rehab PT Goals Patient Stated Goal: Resume previous lifestyle with decreased pain PT Goal Formulation: With patient Time For Goal Achievement: 09/03/14 Potential to Achieve Goals: Good Progress towards PT goals: Progressing toward goals    Frequency  7X/week    PT Plan Current plan remains appropriate    Co-evaluation             End of Session Equipment Utilized During Treatment: Gait belt Activity Tolerance: Patient tolerated treatment well Patient left: in chair;with call bell/phone within reach;with family/visitor present     Time: [id number] PT Time Calculation (min) (ACUTE ONLY): 20 min  Charges:  $Gait Training: 8-22 mins                    G Codes:      Hannah Davis 08/27/2014, 4:25  PM   

## 2014-08-27 NOTE — Evaluation (Signed)
Physical Therapy Evaluation Patient Details Name: Hannah Davis MRN: [id number] DOB: 01-18-1971 Today's Date: 08/27/2014   History of Present Illness  L THR  Clinical Impression  Pt s/p L THR presents with decreased L LE strength/ROM and post op pain limiting functional mobility.  Pt should progress well to d.c home with family assist and HHPT follow up.    Follow Up Recommendations Home health PT    Equipment Recommendations  Rolling walker with 5" wheels    Recommendations for Other Services OT consult     Precautions / Restrictions Precautions Precautions: Fall Restrictions Weight Bearing Restrictions: No Other Position/Activity Restrictions: WBAT      Mobility  Bed Mobility Overal bed mobility: Needs Assistance Bed Mobility: Supine to Sit     Supine to sit: Min assist     General bed mobility comments: cues for sequence and use of R LE to self assist  Transfers Overall transfer level: Needs assistance Equipment used: Rolling walker (2 wheeled) Transfers: Sit to/from Stand Sit to Stand: Min assist         General transfer comment: cues for LE management and use of UEs to self asist  Ambulation/Gait Ambulation/Gait assistance: Min assist Ambulation Distance (Feet): 130 Feet Assistive device: Rolling walker (2 wheeled) Gait Pattern/deviations: Step-to pattern;Step-through pattern;Shuffle Gait velocity: decr   General Gait Details: cues for posture, position from RW, ER on L and initial sequence  Stairs            Wheelchair Mobility    Modified Rankin (Stroke Patients Only)       Balance                                             Pertinent Vitals/Pain Pain Assessment: 0-10 Pain Score: 6  Pain Location: L hip/thigh Pain Descriptors / Indicators: Aching;Burning Pain Intervention(s): Limited activity within patient's tolerance;Monitored during session;Premedicated before session;Ice applied    Home Living  Family/patient expects to be discharged to:: Private residence Living Arrangements: Spouse/significant other Available Help at Discharge: Family Type of Home: Apartment Home Access: Stairs to enter Entrance Stairs-Rails: Right Entrance Stairs-Number of Steps: 4 Home Layout: One level Home Equipment: None      Prior Function Level of Independence: Independent               Hand Dominance        Extremity/Trunk Assessment   Upper Extremity Assessment: Overall WFL for tasks assessed           Lower Extremity Assessment: LLE deficits/detail   LLE Deficits / Details: 3-/5 hip strength with AAROM at hip to 90 flex and 20 abd  Cervical / Trunk Assessment: Normal  Communication   Communication: No difficulties  Cognition Arousal/Alertness: Awake/alert Behavior During Therapy: WFL for tasks assessed/performed Overall Cognitive Status: Within Functional Limits for tasks assessed                      General Comments      Exercises Total Joint Exercises Ankle Circles/Pumps: AROM;Both;15 reps;Supine Quad Sets: AROM;Both;10 reps;Supine Heel Slides: AAROM;Left;15 reps;Supine Hip ABduction/ADduction: AAROM;Left;10 reps;Supine      Assessment/Plan    PT Assessment Patient needs continued PT services  PT Diagnosis Difficulty walking   PT Problem List Decreased strength;Decreased range of motion;Decreased activity tolerance;Decreased mobility;Decreased knowledge of use of DME;Pain  PT Treatment Interventions DME instruction;Gait training;Stair  training;Functional mobility training;Therapeutic activities;Therapeutic exercise;Patient/family education   PT Goals (Current goals can be found in the Care Plan section) Acute Rehab PT Goals Patient Stated Goal: Resume previous lifestyle with decreased pain PT Goal Formulation: With patient Time For Goal Achievement: 09/03/14 Potential to Achieve Goals: Good    Frequency 7X/week   Barriers to discharge         Co-evaluation               End of Session Equipment Utilized During Treatment: Gait belt Activity Tolerance: Patient tolerated treatment well Patient left: in chair;with call bell/phone within reach;with family/visitor present Nurse Communication: Mobility status         Time: 0910-0940 PT Time Calculation (min) (ACUTE ONLY): 30 min   Charges:   PT Evaluation $Initial PT Evaluation Tier I: 1 Procedure PT Treatments $Gait Training: 8-22 mins $Therapeutic Exercise: 8-22 mins   PT G Codes:        Kizzy Olafson Sep 23, 2014, 12:34 PM

## 2014-09-01 NOTE — Discharge Summary (Signed)
Physician Discharge Summary  Patient ID: Hannah Davis MRN: [id number] DOB/AGE: 42/02/1971 42 y.o.  Admit date: 08/26/2014 Discharge date: 08/27/2014   Procedures:  Procedure(s) (LRB): LEFT TOTAL HIP ARTHROPLASTY ANTERIOR APPROACH (Left)  Attending Physician:  Dr. Durene RomansMatthew Olin   Admission Diagnoses:   Left hip primary OA / pain  Discharge Diagnoses:  Principal Problem:   S/P left THA, AA  Past Medical History  Diagnosis Date  . Chicken pox as a child  . PFO (patent foramen ovale)   . Hyperlipidemia 06/05/2011  . Hip pain, left 06/05/2011  . Insomnia 06/05/2011  . S/P gastric surgery   . Bradycardia 11/04/2013  . 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 Family history of adverse reaction to anesthesia     mother had hard time waking up  . Borderline hypertension   . History of skin cancer   . History of kidney stones     HPI:   Hannah Davis, 44 y.o. female, has a history of pain and functional disability in the left hip(s) due to arthritis and patient has failed non-surgical conservative treatments for greater than 12 weeks to include NSAID's and/or analgesics, use of assistive devices and activity modification. Onset of symptoms was gradual starting 3+ years ago with gradually worsening course since that time.The patient noted no past surgery on the left hip(s). Patient currently rates pain in the left hip at 10 out of 10 with activity. Patient has night pain, worsening of pain with activity and weight bearing, trendelenberg gait, pain that interfers with activities of daily living and pain with passive range of motion. Patient has evidence of periarticular osteophytes and joint space narrowing by imaging studies. This condition presents safety issues increasing the risk of falls. There is no current active infection. Risks, benefits and expectations were discussed with the patient. Risks including but not limited to the risk of anesthesia, blood  clots, nerve damage, blood vessel damage, failure of the prosthesis, infection and up to and including death. Patient understand the risks, benefits and expectations and wishes to proceed with surgery.   PCP: Danise EdgeBLYTH, STACEY, MD   Discharged Condition: good  Hospital Course:  Patient underwent the above stated procedure on 08/26/2014. Patient tolerated the procedure well and brought to the recovery room in good condition and subsequently to the floor.  POD #1 BP: 112/52 ; Pulse: 58 ; Temp: 98.2 F (36.8 C) ; Resp: 16 Patient reports pain as mild, pain controlled. Apparently her pain was a little out of control last night, but better today. She is a little nervous about the hip and PT. Issues with urinary retention last night and a foley was placed. Ready to be discharged home. Dorsiflexion/plantar flexion intact, incision: dressing C/D/I, no cellulitis present and compartment soft.   LABS  Basename    HGB  10.3  HCT  30.6    Discharge Exam: General appearance: alert, cooperative and no distress Extremities: Homans sign is negative, no sign of DVT, no edema, redness or tenderness in the calves or thighs and no ulcers, gangrene or trophic changes  Disposition: Home with follow up in 2 weeks   Follow-up Information    Follow up with [facility].   Why:  home health physical therapy   Contact information:   [address] [place] [id number] [phone]       Follow up with Shelda PalLIN,Ulice Follett D, MD. Schedule an appointment as soon as possible for a visit in 2 weeks.  Specialty:  Orthopedic Surgery   Contact information:   [address] [place] [address] [phone]       Discharge Instructions    Call MD / Call 911    Complete by:  As directed   If you experience chest pain or shortness of breath, CALL 911 and be transported to the hospital emergency room.  If you develope a fever above 101 F, pus (white drainage) or increased  drainage or redness at the wound, or calf pain, call your surgeon's office.     Change dressing    Complete by:  As directed   Maintain surgical dressing until follow up in the clinic. If the edges start to pull up, may reinforce with tape. If the dressing is no longer working, may remove and cover with gauze and tape, but must keep the area dry and clean.  Call with any questions or concerns.     Constipation Prevention    Complete by:  As directed   Drink plenty of fluids.  Prune juice may be helpful.  You may use a stool softener, such as Colace (over the counter) 100 mg twice a day.  Use MiraLax (over the counter) for constipation as needed.     Diet - low sodium heart healthy    Complete by:  As directed      Discharge instructions    Complete by:  As directed   Maintain surgical dressing until follow up in the clinic. If the edges start to pull up, may reinforce with tape. If the dressing is no longer working, may remove and cover with gauze and tape, but must keep the area dry and clean.  Follow up in 2 weeks at [facility] Call with any questions or concerns.     Increase activity slowly as tolerated    Complete by:  As directed      TED hose    Complete by:  As directed   Use stockings (TED hose) for 2 weeks on both leg(s).  You may remove them at night for sleeping.     Weight bearing as tolerated    Complete by:  As directed   Laterality:  left  Extremity:  Lower             Medication List    STOP taking these medications        meloxicam 15 MG tablet  Commonly known as:  MOBIC     traMADol 50 MG tablet  Commonly known as:  ULTRAM      TAKE these medications        aspirin 325 MG EC tablet  Take 1 tablet (325 mg total) by mouth 2 (two) times daily.     B-complex with vitamin C tablet  Take 1 tablet by mouth daily.     BIOTIN PO  Take 2 tablets by mouth daily.     cholecalciferol 1000 UNITS tablet  Commonly known as:  VITAMIN D  Take 2,000  Units by mouth daily.     DSS 100 MG Caps  Take 100 mg by mouth 2 (two) times daily.     ferrous sulfate 325 (65 FE) MG tablet  Take 1 tablet (325 mg total) by mouth 3 (three) times daily after meals.     HYDROcodone-acetaminophen 7.5-325 MG per tablet  Commonly known as:  NORCO  Take 1-2 tablets by mouth every 4 (four) hours as needed for moderate pain.     magnesium oxide  400 MG tablet  Commonly known as:  MAG-OX  Take 400 mg by mouth daily.     methocarbamol 500 MG tablet  Commonly known as:  ROBAXIN  Take 1 tablet (500 mg total) by mouth every 6 (six) hours as needed for muscle spasms.     Omega 3 1000 MG Caps  Take 2,000 mg by mouth daily.     polyethylene glycol packet  Commonly known as:  MIRALAX / GLYCOLAX  Take 17 g by mouth 2 (two) times daily.         Signed: Anastasio Auerbach. Danaisha Celli   PA-C  09/01/2014, 2:31 PM

## 2014-12-18 ENCOUNTER — Ambulatory Visit (INDEPENDENT_AMBULATORY_CARE_PROVIDER_SITE_OTHER): Payer: 59 | Admitting: Obstetrics

## 2014-12-18 ENCOUNTER — Encounter: Payer: Self-pay | Admitting: Obstetrics

## 2014-12-18 VITALS — BP 148/87 | HR 52 | Temp 98.6°F | Wt 152.0 lb

## 2014-12-18 DIAGNOSIS — N766 Ulceration of vulva: Secondary | ICD-10-CM

## 2014-12-18 DIAGNOSIS — Z124 Encounter for screening for malignant neoplasm of cervix: Secondary | ICD-10-CM

## 2014-12-18 DIAGNOSIS — Z01419 Encounter for gynecological examination (general) (routine) without abnormal findings: Secondary | ICD-10-CM

## 2014-12-18 DIAGNOSIS — Z1239 Encounter for other screening for malignant neoplasm of breast: Secondary | ICD-10-CM

## 2014-12-18 DIAGNOSIS — N946 Dysmenorrhea, unspecified: Secondary | ICD-10-CM

## 2014-12-18 MED ORDER — HYDROCODONE-ACETAMINOPHEN 10-325 MG PO TABS: 1.0000 | ORAL_TABLET | Freq: Four times a day (QID) | ORAL | Status: DC | PRN

## 2014-12-19 ENCOUNTER — Encounter: Payer: Self-pay | Admitting: Obstetrics

## 2014-12-19 NOTE — Progress Notes (Signed)
Subjective:        Hannah Davis is a 44 y.o. female here for a routine exam.  Current complaints: Has blister on vulva that developed 3 days ago.    Personal health questionnaire:  Is patient Ashkenazi Jewish, have a family history of breast and/or ovarian cancer: no Is there a family history of uterine cancer diagnosed at age < 52, gastrointestinal cancer, urinary tract cancer, family member who is a Personnel officer syndrome-associated carrier: no Is the patient overweight and hypertensive, family history of diabetes, personal history of gestational diabetes, preeclampsia or PCOS: no Is patient over 66, have PCOS,  family history of premature CHD under age 66, diabetes, smoke, have hypertension or peripheral artery disease:  no At any time, has a partner hit, kicked or otherwise hurt or frightened you?: no Over the past 2 weeks, have you felt down, depressed or hopeless?: no Over the past 2 weeks, have you felt little interest or pleasure in doing things?:no   Gynecologic History No LMP recorded. Patient has had an ablation. Contraception: tubal ligation Last Pap: 2015. Results were: normal Last mammogram: none. Results were: none  Obstetric History OB History  No data available    Past Medical History  Diagnosis Date  . Chicken pox as a child  . PFO (patent foramen ovale)   . Hyperlipidemia 06/05/2011  . Hip pain, left 06/05/2011  . Insomnia 06/05/2011  . S/P gastric surgery   . Bradycardia 11/04/2013  . 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 Family history of adverse reaction to anesthesia     mother had hard time waking up  . Borderline hypertension   . History of skin cancer   . History of kidney stones     Past Surgical History  Procedure Laterality Date  . Endometrial ablation  11-11  . Tubes tied  3-03  . Arthroscopy on right wrist  1997  . Bunionectomy  2005    left foot big toe  . Cesarean section  1-00  . Laparoscopic gastric sleeve  resection  10/ 2013  . 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  B Complex-C (B-COMPLEX WITH VITAMIN C) tablet, Take 1 tablet by mouth daily., Disp: , Rfl:  .  BIOTIN PO, Take 2 tablets by mouth daily., Disp: , Rfl:  .  cholecalciferol (VITAMIN D) 1000 UNITS tablet, Take 2,000 Units by mouth daily., Disp: , Rfl:  .  magnesium oxide (MAG-OX) 400 MG tablet, Take 400 mg by mouth daily., Disp: , Rfl:  .  Omega 3 1000 MG CAPS, Take 2,000 mg by mouth daily., Disp: , Rfl:  .  docusate sodium 100 MG CAPS, Take 100 mg by mouth 2 (two) times daily. (Patient not taking: Reported on 12/18/2014), Disp: 10 capsule, Rfl: 0 .  ferrous sulfate 325 (65 FE) MG tablet, Take 1 tablet (325 mg total) by mouth 3 (three) times daily after meals. (Patient not taking: Reported on 12/18/2014), Disp: , Rfl: 3 .  HYDROcodone-acetaminophen (NORCO) 10-325 MG per tablet, Take 1 tablet by mouth every 6 (six) hours as needed for moderate pain or severe pain., Disp: 40 tablet, Rfl: 0 .  polyethylene glycol (MIRALAX / GLYCOLAX) packet, Take 17 g by mouth 2 (two) times daily. (Patient not taking: Reported on 12/18/2014), Disp: 14 each,  Rfl: 0 Allergies  Allergen Reactions  . Codeine Hives  . Ibuprofen Nausea And Vomiting  . Latex Hives    History  Substance Use Topics  . Smoking status: Never Smoker   . Smokeless tobacco: Never Used  . Alcohol Use: 0.0 oz/week    0 Standard drinks or equivalent per week     Comment: a beer on the weekend    Family History  Problem Relation Age of Onset  . Cancer Mother 1257    kidney/ removed kidney in remission  . Fibromyalgia Mother   . Asthma Mother   . Stroke Mother   . Heart attack Mother   . Arthritis Mother     rheumatoid  . Diabetes Father     type 2  . Heart disease Father   . Other  Father     open heart bypass surgery/ smoked  . Other Sister     kidney problems  . Heart disease Brother   . Other Brother     open heart surgery  . Drug abuse Brother     cocaine  . Other Maternal Grandmother     brain tumor  . Heart attack Maternal Grandfather   . Heart disease Maternal Grandfather     MI  . Stroke Paternal Grandmother [id number]  . Other Paternal Grandfather     enlarged heart  . Obesity Paternal Grandfather   . Stroke Brother     X 2      Review of Systems  Constitutional: negative for fatigue and weight loss Respiratory: negative for cough and wheezing Cardiovascular: negative for chest pain, fatigue and palpitations Gastrointestinal: negative for abdominal pain and change in bowel habits Musculoskeletal:negative for myalgias Neurological: negative for gait problems and tremors Behavioral/Psych: negative for abusive relationship, depression Endocrine: negative for temperature intolerance   Genitourinary:negative for abnormal menstrual periods, genital lesions, hot flashes, sexual problems and vaginal discharge Integument/breast: negative for breast lump, breast tenderness, nipple discharge and skin lesion(s)    Objective:       BP 148/87 mmHg  Pulse 52  Temp(Src) 98.6 F (37 C)  Wt 152 lb (68.947 kg) General:   alert  Skin:   no rash or abnormalities  Lungs:   clear to auscultation bilaterally  Heart:   regular rate and rhythm, S1, S2 normal, no murmur, click, rub or gallop  Breasts:   normal without suspicious masses, skin or nipple changes or axillary nodes  Abdomen:  normal findings: no organomegaly, soft, non-tender and no hernia  Pelvis:  External genitalia: normal general appearance Urinary system: urethral meatus normal and bladder without fullness, nontender Vaginal: normal without tenderness, induration or masses Cervix: normal appearance Adnexa: normal bimanual exam Uterus: anteverted and non-tender, normal size   Lab Review Urine  pregnancy test Labs reviewed no Radiologic studies reviewed no    Assessment:    Healthy female exam.    Vulva ulceration   Plan:   R/O Genital Herpes. Herpes culture sent   Education reviewed: calcium supplements, low fat, low cholesterol diet, safe sex/STD prevention, self breast exams and weight bearing exercise. Mammogram ordered. Follow up in: 1 year.   Meds ordered this encounter  Medications  . HYDROcodone-acetaminophen (NORCO) 10-325 MG per tablet    Sig: Take 1 tablet by mouth every 6 (six) hours as needed for moderate pain or severe pain.    Dispense:  40 tablet    Refill:  0   Orders Placed This Encounter  Procedures  . SureSwab, Vaginosis/Vaginitis  Plus  . Herpes simplex virus culture  . MM Digital Screening    Standing Status: Future     Number of Occurrences:      Standing Expiration Date: 02/17/2016    Order Specific Question:  Reason for Exam (SYMPTOM  OR DIAGNOSIS REQUIRED)    Answer:  screening    Order Specific Question:  Is the patient pregnant?    Answer:  No    Order Specific Question:  Preferred imaging location?    [facility]

## 2014-12-22 LAB — SURESWAB, VAGINOSIS/VAGINITIS PLUS
Atopobium vaginae: 5.3 Log (cells/mL)
BV CATEGORY: UNDETERMINED — AB
C. ALBICANS, DNA: NOT DETECTED
C. GLABRATA, DNA: NOT DETECTED
C. TROPICALIS, DNA: NOT DETECTED
C. parapsilosis, DNA: NOT DETECTED
C. trachomatis RNA, TMA: NOT DETECTED
GARDNERELLA VAGINALIS: 7.2 Log (cells/mL)
LACTOBACILLUS SPECIES: >8 Log (cells/mL)
MEGASPHAERA SPECIES: NOT DETECTED Log (cells/mL)
N. gonorrhoeae RNA, TMA: NOT DETECTED
T. VAGINALIS RNA, QL TMA: NOT DETECTED

## 2014-12-22 LAB — PAP IG AND HPV HIGH-RISK: HPV DNA HIGH RISK: NOT DETECTED

## 2014-12-22 LAB — HERPES SIMPLEX VIRUS CULTURE: Organism ID, Bacteria: NOT DETECTED

## 2014-12-23 ENCOUNTER — Other Ambulatory Visit: Payer: Self-pay | Admitting: Obstetrics

## 2014-12-23 DIAGNOSIS — N760 Acute vaginitis: Principal | ICD-10-CM

## 2014-12-23 DIAGNOSIS — B9689 Other specified bacterial agents as the cause of diseases classified elsewhere: Secondary | ICD-10-CM

## 2014-12-23 MED ORDER — TINIDAZOLE 500 MG PO TABS: 1000.0000 mg | ORAL_TABLET | Freq: Every day | ORAL | Status: DC

## 2014-12-25 ENCOUNTER — Ambulatory Visit (HOSPITAL_COMMUNITY)
Admission: RE | Admit: 2014-12-25 | Discharge: 2014-12-25 | Disposition: A | Discharge status: Home / Self Care | Payer: 59 | Source: Ambulatory Visit | Attending: Obstetrics | Admitting: Obstetrics

## 2014-12-25 DIAGNOSIS — Z1231 Encounter for screening mammogram for malignant neoplasm of breast: Secondary | ICD-10-CM | POA: Insufficient documentation

## 2014-12-25 DIAGNOSIS — Z1239 Encounter for other screening for malignant neoplasm of breast: Secondary | ICD-10-CM

## 2015-01-15 ENCOUNTER — Telehealth: Payer: Self-pay | Admitting: *Deleted

## 2015-01-16 ENCOUNTER — Other Ambulatory Visit: Payer: Self-pay | Admitting: Obstetrics

## 2015-01-16 DIAGNOSIS — N946 Dysmenorrhea, unspecified: Secondary | ICD-10-CM

## 2015-01-16 MED ORDER — MEFENAMIC ACID 250 MG PO CAPS: ORAL_CAPSULE | ORAL | Status: DC

## 2015-01-27 ENCOUNTER — Telehealth: Payer: Self-pay | Admitting: *Deleted

## 2015-01-27 NOTE — Telephone Encounter (Signed)
Patient states she had recently requested a refill on her pain medication during her menstrual cycles. Patient states she was given a prescription for Ponstel and it's not working. Patient is requesting a different prescription to help with her cramps during her cycle.

## 2015-01-29 ENCOUNTER — Other Ambulatory Visit: Payer: Self-pay | Admitting: Obstetrics

## 2015-01-29 DIAGNOSIS — N946 Dysmenorrhea, unspecified: Secondary | ICD-10-CM

## 2015-01-29 MED ORDER — MELOXICAM 7.5 MG PO TABS: ORAL_TABLET | ORAL | Status: DC

## 2015-01-29 NOTE — Telephone Encounter (Signed)
Spoke with patient and she states she would like to try Mobic. Patient advised would have prescription sent to the pharmacy.

## 2015-01-29 NOTE — Telephone Encounter (Signed)
The only other medications that I can Rx are Ibuprofen, Naprosyn or Mobic.  NO NARCOTICS can be Rx.Marland Kitchen

## 2015-02-13 NOTE — Telephone Encounter (Signed)
Error

## 2015-02-24 ENCOUNTER — Other Ambulatory Visit: Payer: Self-pay | Admitting: Obstetrics

## 2015-06-18 ENCOUNTER — Ambulatory Visit: Payer: 59 | Admitting: Obstetrics

## 2015-09-25 ENCOUNTER — Encounter: Payer: Self-pay | Admitting: Obstetrics

## 2015-09-25 ENCOUNTER — Ambulatory Visit (INDEPENDENT_AMBULATORY_CARE_PROVIDER_SITE_OTHER): Payer: 59 | Admitting: Obstetrics

## 2015-09-25 VITALS — BP 147/92 | HR 68 | Temp 98.3°F | Wt 170.0 lb

## 2015-09-25 DIAGNOSIS — N760 Acute vaginitis: Secondary | ICD-10-CM

## 2015-09-25 DIAGNOSIS — N939 Abnormal uterine and vaginal bleeding, unspecified: Secondary | ICD-10-CM | POA: Diagnosis not present

## 2015-09-25 DIAGNOSIS — N946 Dysmenorrhea, unspecified: Secondary | ICD-10-CM

## 2015-09-25 DIAGNOSIS — A499 Bacterial infection, unspecified: Secondary | ICD-10-CM

## 2015-09-25 DIAGNOSIS — B9689 Other specified bacterial agents as the cause of diseases classified elsewhere: Secondary | ICD-10-CM

## 2015-09-25 MED ORDER — HYDROCODONE-IBUPROFEN 7.5-200 MG PO TABS: 1.0000 | ORAL_TABLET | Freq: Three times a day (TID) | ORAL | Status: DC | PRN

## 2015-09-25 MED ORDER — HYDROCODONE-IBUPROFEN 7.5-200 MG PO TABS: 1.0000 | ORAL_TABLET | Freq: Four times a day (QID) | ORAL | Status: DC | PRN

## 2015-09-25 NOTE — Progress Notes (Signed)
Patient ID: Hannah Davis, female   DOB: Dec 11, 1970, 45 y.o.   MRN: [id number]  Chief Complaint  Patient presents with  . Vaginitis    Discharge with odor    HPI Hannah Davis is a 45 y.o. female.  Vaginal discharge with odor.  Periods continue to be painful, but less after Endometrial Ablation. HPI  Past Medical History  Diagnosis Date  . Chicken pox as a child  . PFO (patent foramen ovale)   . Hyperlipidemia 06/05/2011  . Hip pain, left 06/05/2011  . Insomnia 06/05/2011  . S/P gastric surgery   . Bradycardia 11/04/2013  . 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 Family history of adverse reaction to anesthesia     mother had hard time waking up  . Borderline hypertension   . History of skin cancer   . History of kidney stones     Past Surgical History  Procedure Laterality Date  . Endometrial ablation  11-11  . Tubes tied  3-03  . Arthroscopy on right wrist  1997  . Bunionectomy  2005    left foot big toe  . Cesarean section  1-00  . Laparoscopic gastric sleeve resection  10/ 2013  . 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 Cancer Mother 10    kidney/ removed kidney in remission  . Fibromyalgia Mother   . Asthma Mother   . Stroke Mother   . Heart attack Mother   . Arthritis Mother     rheumatoid  . Diabetes Father     type 2  . Heart disease Father   . Other Father     open heart bypass surgery/ smoked  . Other Sister     kidney problems  . Heart disease Brother   . Other Brother     open heart surgery  . Drug abuse Brother     cocaine  . Other Maternal Grandmother     brain tumor  . Heart attack Maternal Grandfather   . Heart disease Maternal Grandfather     MI  . Stroke Paternal Grandmother 4  . Other Paternal Grandfather      enlarged heart  . Obesity Paternal Grandfather   . Stroke Brother     X 2    Social History Social History  Substance Use Topics  . Smoking status: Never Smoker   . Smokeless tobacco: Never Used  . Alcohol Use: 0.0 oz/week    0 Standard drinks or equivalent per week     Comment: a beer on the weekend    Allergies  Allergen Reactions  . Codeine Hives  . Ibuprofen Nausea And Vomiting  . Latex Hives    Current Outpatient Prescriptions  Medication Sig Dispense Refill  . B Complex-C (B-COMPLEX WITH VITAMIN C) tablet Take 1 tablet by mouth daily.    Marland Kitchen BIOTIN PO Take 2 tablets by mouth daily.    . cholecalciferol (VITAMIN D) 1000 UNITS tablet Take 2,000 Units by mouth daily.    . magnesium oxide (MAG-OX) 400 MG tablet Take 400 mg by mouth daily.    . Omega 3 1000 MG CAPS Take 2,000 mg by mouth daily.    Marland Kitchen HYDROcodone-ibuprofen (VICOPROFEN) 7.5-200 MG tablet Take 1 tablet by mouth every 6 (six) hours  as needed for moderate pain or severe pain. 40 tablet 0   No current facility-administered medications for this visit.    Review of Systems Review of Systems Constitutional: negative for fatigue and weight loss Respiratory: negative for cough and wheezing Cardiovascular: negative for chest pain, fatigue and palpitations Gastrointestinal: negative for abdominal pain and change in bowel habits Genitourinary: positive for vaginal discharge with odor.  Painful periods Integument/breast: negative for nipple discharge Musculoskeletal:negative for myalgias Neurological: negative for gait problems and tremors Behavioral/Psych: negative for abusive relationship, depression Endocrine: negative for temperature intolerance     Blood pressure 147/92, pulse 68, temperature 98.3 F (36.8 C), weight 170 lb (77.111 kg), last menstrual period 09/11/2015.  Physical Exam Physical Exam           General:  Alert and no distress Abdomen:  normal findings: no organomegaly, soft, non-tender and  no hernia  Pelvis:  External genitalia: normal general appearance Urinary system: urethral meatus normal and bladder without fullness, nontender Vaginal: normal without tenderness, induration or masses.  Grey, thin, malodorous discharge Cervix: normal appearance Adnexa: normal bimanual exam Uterus: anteverted and non-tender, normal size      Data Reviewed Wet prep  Assessment     BV  Dyspareunia    Plan    Tindamax dispensed  Vicoprofen Rx  F/U for annual in 6 months  Orders Placed This Encounter  Procedures  . SureSwab, Vaginosis/Vaginitis Plus   Meds ordered this encounter  Medications  . DISCONTD: HYDROcodone-ibuprofen (VICOPROFEN) 7.5-200 MG tablet    Sig: Take 1 tablet by mouth every 8 (eight) hours as needed for moderate pain.    Dispense:  30 tablet    Refill:  0  . HYDROcodone-ibuprofen (VICOPROFEN) 7.5-200 MG tablet    Sig: Take 1 tablet by mouth every 6 (six) hours as needed for moderate pain or severe pain.    Dispense:  40 tablet    Refill:  0

## 2015-09-30 LAB — SURESWAB, VAGINOSIS/VAGINITIS PLUS
Atopobium vaginae: 7.1 Log (cells/mL)
BV CATEGORY: UNDETERMINED — AB
C. GLABRATA, DNA: NOT DETECTED
C. albicans, DNA: NOT DETECTED
C. parapsilosis, DNA: NOT DETECTED
C. trachomatis RNA, TMA: NOT DETECTED
C. tropicalis, DNA: NOT DETECTED
LACTOBACILLUS SPECIES: DETECTED Log (cells/mL)
MEGASPHAERA SPECIES: >8 Log (cells/mL)
N. gonorrhoeae RNA, TMA: NOT DETECTED
T. vaginalis RNA, QL TMA: NOT DETECTED

## 2015-10-01 ENCOUNTER — Other Ambulatory Visit: Payer: Self-pay | Admitting: Obstetrics

## 2015-10-01 DIAGNOSIS — B9689 Other specified bacterial agents as the cause of diseases classified elsewhere: Secondary | ICD-10-CM

## 2015-10-01 DIAGNOSIS — N760 Acute vaginitis: Principal | ICD-10-CM

## 2015-10-01 MED ORDER — TINIDAZOLE 500 MG PO TABS: 1000.0000 mg | ORAL_TABLET | Freq: Every day | ORAL | Status: DC

## 2015-10-20 ENCOUNTER — Other Ambulatory Visit: Payer: Self-pay | Admitting: *Deleted

## 2015-10-20 ENCOUNTER — Encounter: Payer: Self-pay | Admitting: *Deleted

## 2015-10-20 DIAGNOSIS — B9689 Other specified bacterial agents as the cause of diseases classified elsewhere: Secondary | ICD-10-CM

## 2015-10-20 DIAGNOSIS — N760 Acute vaginitis: Principal | ICD-10-CM

## 2015-10-20 MED ORDER — METRONIDAZOLE 500 MG PO TABS: 500.0000 mg | ORAL_TABLET | Freq: Two times a day (BID) | ORAL | Status: DC

## 2015-10-20 NOTE — Progress Notes (Signed)
Rx change from Tinidazole to Metronidazole due to insurance coverage.

## 2015-10-26 ENCOUNTER — Telehealth: Payer: Self-pay | Admitting: *Deleted

## 2015-10-26 DIAGNOSIS — N390 Urinary tract infection, site not specified: Secondary | ICD-10-CM

## 2015-10-26 MED ORDER — NITROFURANTOIN MONOHYD MACRO 100 MG PO CAPS
100.0000 mg | ORAL_CAPSULE | Freq: Two times a day (BID) | ORAL | Status: DC
Start: 2015-10-26 — End: 2015-12-24

## 2015-10-26 NOTE — Telephone Encounter (Signed)
Patient states she is having UTI symptoms- burning with urination, urgency, and strong odor. She is leaving to go out of town for work and would like to be treated. Rx called to pharmacy per Dr Clearance CootsHarper permission.

## 2015-12-09 ENCOUNTER — Telehealth: Payer: Self-pay | Admitting: *Deleted

## 2015-12-09 NOTE — Telephone Encounter (Signed)
Denied. Needs appt

## 2015-12-09 NOTE — Telephone Encounter (Signed)
Patient is calling to request a refill of Vicoprofen. Call forwarded to provider.

## 2015-12-10 ENCOUNTER — Other Ambulatory Visit: Payer: Self-pay | Admitting: Obstetrics

## 2015-12-10 DIAGNOSIS — N946 Dysmenorrhea, unspecified: Secondary | ICD-10-CM

## 2015-12-10 MED ORDER — HYDROCODONE-IBUPROFEN 7.5-200 MG PO TABS: 1.0000 | ORAL_TABLET | Freq: Four times a day (QID) | ORAL | Status: DC | PRN

## 2015-12-10 NOTE — Telephone Encounter (Signed)
Patient was just in the office February.

## 2015-12-24 ENCOUNTER — Ambulatory Visit (INDEPENDENT_AMBULATORY_CARE_PROVIDER_SITE_OTHER): Payer: 59 | Admitting: Obstetrics

## 2015-12-24 ENCOUNTER — Encounter: Payer: Self-pay | Admitting: Obstetrics

## 2015-12-24 VITALS — BP 159/101 | HR 66 | Wt 164.0 lb

## 2015-12-24 DIAGNOSIS — R41840 Attention and concentration deficit: Secondary | ICD-10-CM

## 2015-12-24 DIAGNOSIS — Z01419 Encounter for gynecological examination (general) (routine) without abnormal findings: Secondary | ICD-10-CM

## 2015-12-24 DIAGNOSIS — Z0000 Encounter for general adult medical examination without abnormal findings: Secondary | ICD-10-CM

## 2015-12-25 ENCOUNTER — Encounter: Payer: Self-pay | Admitting: Obstetrics

## 2015-12-25 NOTE — Progress Notes (Signed)
Subjective:        Hannah Davis is a 45 y.o. female here for a routine exam.  Current complaints: Poor concentration and worsening ability to focus on things.    Personal health questionnaire:  Is patient Ashkenazi Jewish, have a family history of breast and/or ovarian cancer: no Is there a family history of uterine cancer diagnosed at age < 3250, gastrointestinal cancer, urinary tract cancer, family member who is a Personnel officerLynch syndrome-associated carrier: no Is the patient overweight and hypertensive, family history of diabetes, personal history of gestational diabetes, preeclampsia or PCOS: no Is patient over 7055, have PCOS,  family history of premature CHD under age 43, diabetes, smoke, have hypertension or peripheral artery disease:  no At any time, has a partner hit, kicked or otherwise hurt or frightened you?: no Over the past 2 weeks, have you felt down, depressed or hopeless?: no Over the past 2 weeks, have you felt little interest or pleasure in doing things?:no   Gynecologic History Patient's last menstrual period was 12/13/2015. Contraception: tubal ligation Last Pap: 2016. Results were: normal Last mammogram: 2016. Results were: normal  Obstetric History OB History  No data available    Past Medical History  Diagnosis Date  . Chicken pox as a child  . PFO (patent foramen ovale)   . Hyperlipidemia 06/05/2011  . Hip pain, left 06/05/2011  . Insomnia 06/05/2011  . S/P gastric surgery   . Bradycardia 11/04/2013  . 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 Family history of adverse reaction to anesthesia     mother had hard time waking up  . Borderline hypertension   . History of skin cancer   . History of kidney stones     Past Surgical History  Procedure Laterality Date  . Endometrial ablation  11-11  . Tubes tied  3-03  . Arthroscopy on right wrist  1997  . Bunionectomy  2005    left foot big toe  . Cesarean section  1-00  . Laparoscopic  gastric sleeve resection  10/ 2013  . Total hip arthroplasty Left 08/26/2014    Procedure: LEFT TOTAL HIP ARTHROPLASTY ANTERIOR APPROACH;  Surgeon: Shelda PalMatthew D Olin, MD;  Location: [facility];  Service: Orthopedics;  Laterality: Left;     Current outpatient prescriptions:  .  B Complex-C (B-COMPLEX WITH VITAMIN C) tablet, Take 1 tablet by mouth daily., Disp: , Rfl:  .  BIOTIN PO, Take 2 tablets by mouth daily., Disp: , Rfl:  .  cholecalciferol (VITAMIN D) 1000 UNITS tablet, Take 2,000 Units by mouth daily., Disp: , Rfl:  .  HYDROcodone-ibuprofen (VICOPROFEN) 7.5-200 MG tablet, Take 1 tablet by mouth every 6 (six) hours as needed for moderate pain or severe pain., Disp: 40 tablet, Rfl: 0 .  magnesium oxide (MAG-OX) 400 MG tablet, Take 400 mg by mouth daily., Disp: , Rfl:  .  Omega 3 1000 MG CAPS, Take 2,000 mg by mouth daily., Disp: , Rfl:  Allergies  Allergen Reactions  . Codeine Hives  . Ibuprofen Nausea And Vomiting  . Latex Hives    Social History  Substance Use Topics  . Smoking status: Never Smoker   . Smokeless tobacco: Never Used  . Alcohol Use: 0.0 oz/week    0 Standard drinks or equivalent per week     Comment: a beer on the weekend    Family History  Problem Relation Age of Onset  . Cancer Mother 9157    kidney/ removed kidney in  remission  . Fibromyalgia Mother   . Asthma Mother   . Stroke Mother   . Heart attack Mother   . Arthritis Mother     rheumatoid  . Diabetes Father     type 2  . Heart disease Father   . Other Father     open heart bypass surgery/ smoked  . Other Sister     kidney problems  . Heart disease Brother   . Other Brother     open heart surgery  . Drug abuse Brother     cocaine  . Other Maternal Grandmother     brain tumor  . Heart attack Maternal Grandfather   . Heart disease Maternal Grandfather     MI  . Stroke Paternal Grandmother 43  . Other Paternal Grandfather     enlarged heart  . Obesity Paternal Grandfather   . Stroke Brother      X 2      Review of Systems  Constitutional: negative for fatigue and weight loss Respiratory: negative for cough and wheezing Cardiovascular: negative for chest pain, fatigue and palpitations Gastrointestinal: negative for abdominal pain and change in bowel habits Musculoskeletal:negative for myalgias Neurological: negative for gait problems and tremors Behavioral/Psych: negative for abusive relationship, depression.  Getting harder and harder to concentrate and focus Endocrine: negative for temperature intolerance   Genitourinary:negative for abnormal menstrual periods, genital lesions, hot flashes, sexual problems and vaginal discharge Integument/breast: negative for breast lump, breast tenderness, nipple discharge and skin lesion(s)    Objective:       BP 159/101 mmHg  Pulse 66  Wt 164 lb (74.39 kg)  LMP 12/13/2015 General:   alert  Skin:   no rash or abnormalities  Lungs:   clear to auscultation bilaterally  Heart:   regular rate and rhythm, S1, S2 normal, no murmur, click, rub or gallop  Breasts:   normal without suspicious masses, skin or nipple changes or axillary nodes  Abdomen:  normal findings: no organomegaly, soft, non-tender and no hernia  Pelvis:  External genitalia: normal general appearance Urinary system: urethral meatus normal and bladder without fullness, nontender Vaginal: normal without tenderness, induration or masses Cervix: normal appearance Adnexa: normal bimanual exam Uterus: anteverted and non-tender, normal size   Lab Review Urine pregnancy test Labs reviewed yes Radiologic studies reviewed yes    Assessment:    Healthy female exam.    Decreased mental fortitude, probably as a result of stressful lifestyle and work.   Plan:     Referred to Internal Medicine for routine health maintenance  Education reviewed: calcium supplements, low fat, low cholesterol diet, safe sex/STD prevention, self breast exams and weight bearing  exercise. Mammogram ordered. Follow up in: 1 year.   No orders of the defined types were placed in this encounter.   Orders Placed This Encounter  Procedures  . Ambulatory referral to Internal Medicine    Referral Priority:  Routine    Referral Type:  Consultation    Referral Reason:  Specialty Services Required    Requested Specialty:  Internal Medicine    Number of Visits Requested:  1

## 2015-12-29 ENCOUNTER — Other Ambulatory Visit: Payer: Self-pay | Admitting: Obstetrics

## 2015-12-29 ENCOUNTER — Encounter: Payer: Self-pay | Admitting: *Deleted

## 2015-12-29 DIAGNOSIS — B9689 Other specified bacterial agents as the cause of diseases classified elsewhere: Secondary | ICD-10-CM

## 2015-12-29 DIAGNOSIS — N760 Acute vaginitis: Principal | ICD-10-CM

## 2015-12-29 MED ORDER — METRONIDAZOLE 500 MG PO TABS: 500.0000 mg | ORAL_TABLET | Freq: Two times a day (BID) | ORAL | Status: DC

## 2015-12-30 LAB — NUSWAB VG+, CANDIDA 6SP
ATOPOBIUM VAGINAE: HIGH {score} — AB
BVAB 2: HIGH {score} — AB
CANDIDA ALBICANS, NAA: POSITIVE — AB
CANDIDA KRUSEI, NAA: NEGATIVE
CANDIDA LUSITANIAE, NAA: NEGATIVE
CANDIDA PARAPSILOSIS, NAA: NEGATIVE
CHLAMYDIA TRACHOMATIS, NAA: NEGATIVE
Candida glabrata, NAA: NEGATIVE
Candida tropicalis, NAA: NEGATIVE
Neisseria gonorrhoeae, NAA: NEGATIVE
TRICH VAG BY NAA: NEGATIVE

## 2015-12-30 LAB — PAP IG AND HPV HIGH-RISK
HPV, high-risk: POSITIVE — AB
PAP Smear Comment: 0

## 2015-12-31 ENCOUNTER — Other Ambulatory Visit: Payer: Self-pay | Admitting: Obstetrics

## 2015-12-31 DIAGNOSIS — B373 Candidiasis of vulva and vagina: Secondary | ICD-10-CM

## 2015-12-31 DIAGNOSIS — B3731 Acute candidiasis of vulva and vagina: Secondary | ICD-10-CM

## 2015-12-31 MED ORDER — FLUCONAZOLE 150 MG PO TABS: 150.0000 mg | ORAL_TABLET | Freq: Once | ORAL | Status: DC

## 2016-01-01 ENCOUNTER — Telehealth: Payer: Self-pay | Admitting: *Deleted

## 2016-01-01 NOTE — Telephone Encounter (Signed)
Pt called to office regarding Rx. Return call to pt. Made aware of lab results and Rx sent by provider.

## 2016-03-14 ENCOUNTER — Telehealth: Payer: Self-pay | Admitting: *Deleted

## 2016-03-14 NOTE — Telephone Encounter (Signed)
Patient is calling to request Dr Clearance CootsHarper refill her Paschal DoppVico profen. It has been 3 months since her last Rx and she is getting ready to start her cycle. Call forwarded to the provider for review.

## 2016-03-16 ENCOUNTER — Telehealth: Payer: Self-pay | Admitting: *Deleted

## 2016-03-16 ENCOUNTER — Other Ambulatory Visit: Payer: Self-pay | Admitting: Obstetrics

## 2016-03-16 ENCOUNTER — Other Ambulatory Visit: Payer: Self-pay | Admitting: Physical Medicine and Rehabilitation

## 2016-03-16 DIAGNOSIS — N9489 Other specified conditions associated with female genital organs and menstrual cycle: Secondary | ICD-10-CM

## 2016-03-16 DIAGNOSIS — N946 Dysmenorrhea, unspecified: Secondary | ICD-10-CM

## 2016-03-16 MED ORDER — HYDROCODONE-IBUPROFEN 7.5-200 MG PO TABS: 1.0000 | ORAL_TABLET | Freq: Four times a day (QID) | ORAL | 0 refills | Status: DC | PRN

## 2016-03-16 NOTE — Telephone Encounter (Signed)
Patient aware prescription is ready for pick up

## 2016-03-22 ENCOUNTER — Other Ambulatory Visit: Payer: Self-pay | Admitting: Obstetrics

## 2016-03-22 ENCOUNTER — Ambulatory Visit
Admission: RE | Admit: 2016-03-22 | Discharge: 2016-03-22 | Disposition: A | Discharge status: Home / Self Care | Payer: 59 | Source: Ambulatory Visit | Attending: Physical Medicine and Rehabilitation | Admitting: Physical Medicine and Rehabilitation

## 2016-03-22 DIAGNOSIS — N9489 Other specified conditions associated with female genital organs and menstrual cycle: Secondary | ICD-10-CM

## 2016-03-22 DIAGNOSIS — N83201 Unspecified ovarian cyst, right side: Secondary | ICD-10-CM

## 2016-04-13 ENCOUNTER — Other Ambulatory Visit: Payer: 59

## 2016-04-13 DIAGNOSIS — N83201 Unspecified ovarian cyst, right side: Secondary | ICD-10-CM

## 2016-04-14 LAB — CA 125: CA 125: 19.1 U/mL (ref 0.0–38.1)

## 2016-05-27 ENCOUNTER — Telehealth: Payer: Self-pay | Admitting: *Deleted

## 2016-05-27 NOTE — Telephone Encounter (Signed)
Patient is wants a refill on her Vicoprofen. Please call her and let her know when to pick it up.

## 2016-05-30 NOTE — Telephone Encounter (Signed)
We can't continue Vicoprofen.

## 2016-06-01 ENCOUNTER — Other Ambulatory Visit: Payer: Self-pay | Admitting: Obstetrics

## 2016-06-01 DIAGNOSIS — N946 Dysmenorrhea, unspecified: Secondary | ICD-10-CM

## 2016-06-01 MED ORDER — HYDROCODONE-IBUPROFEN 7.5-200 MG PO TABS: 1.0000 | ORAL_TABLET | Freq: Four times a day (QID) | ORAL | 0 refills | Status: AC | PRN

## 2016-06-01 NOTE — Telephone Encounter (Signed)
Vicoprofen Rx

## 2016-06-01 NOTE — Telephone Encounter (Signed)
Patient notified- she states she does not abuse and she only refills every 3 months- she will be here at 3:00 today with her daughter.

## 2016-06-10 ENCOUNTER — Other Ambulatory Visit: Payer: Self-pay | Admitting: Obstetrics

## 2016-06-10 DIAGNOSIS — B9689 Other specified bacterial agents as the cause of diseases classified elsewhere: Secondary | ICD-10-CM

## 2016-06-10 DIAGNOSIS — N760 Acute vaginitis: Principal | ICD-10-CM

## 2016-11-02 ENCOUNTER — Other Ambulatory Visit: Payer: Self-pay | Admitting: Obstetrics

## 2016-11-02 DIAGNOSIS — N760 Acute vaginitis: Principal | ICD-10-CM

## 2016-11-02 DIAGNOSIS — B9689 Other specified bacterial agents as the cause of diseases classified elsewhere: Secondary | ICD-10-CM

## 2016-12-26 ENCOUNTER — Ambulatory Visit: Payer: Self-pay | Admitting: Obstetrics

## 2016-12-30 ENCOUNTER — Ambulatory Visit: Payer: 59 | Admitting: Obstetrics

## 2017-01-12 ENCOUNTER — Ambulatory Visit: Payer: 59 | Admitting: Obstetrics

## 2017-02-01 ENCOUNTER — Ambulatory Visit: Payer: 59 | Admitting: Obstetrics

## 2017-02-11 IMAGING — US US PELVIS COMPLETE
1 series · 13 of 25 positions shown · non-contrast
Comparison: 02/03/2006 ; correlation is made with the report of an
outside MR dated 02/29/2016

ADDENDUM:
Voice recognition error in report, concerning uterine dimensions.

Uterus measures 9.0 x 4.1 x 5.0 cm.
CLINICAL DATA: RIGHT adnexal mass on outside MR
EXAM:
TRANSABDOMINAL AND TRANSVAGINAL ULTRASOUND OF PELVIS
TECHNIQUE: Both transabdominal and transvaginal ultrasound examinations of the
pelvis were performed. Transabdominal technique was performed for
global imaging of the pelvis including uterus, ovaries, adnexal
regions, and pelvic cul-de-sac. It was necessary to proceed with
endovaginal exam following the transabdominal exam to visualize the
RIGHT ovary and RIGHT adnexa as well as endometrial complex.

[Series 1: us pelvis complete · 0.30mm/px · 13 of 109 slices shown]
[im 1/109]
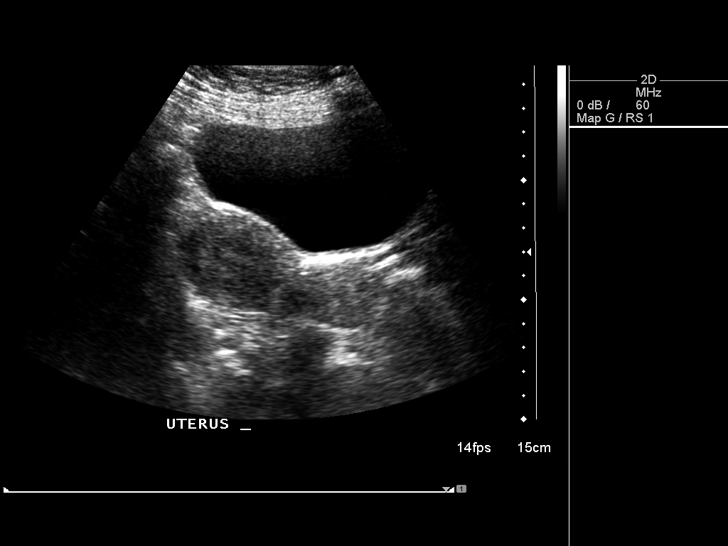
[im 10/109]
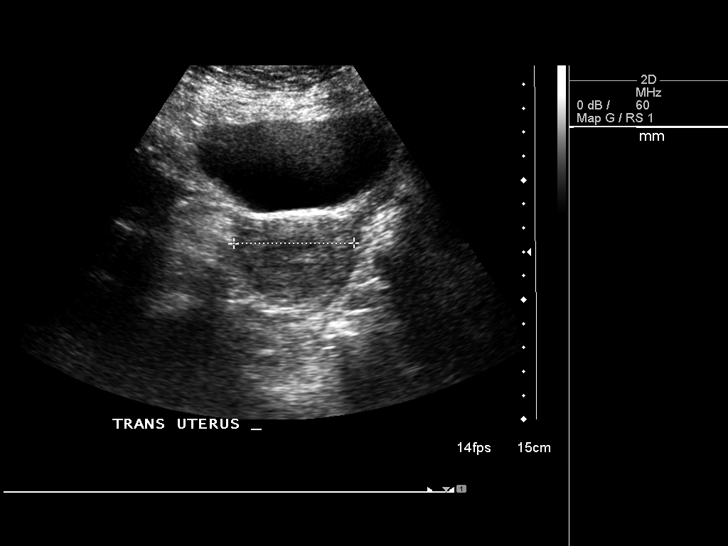
[im 19/109]
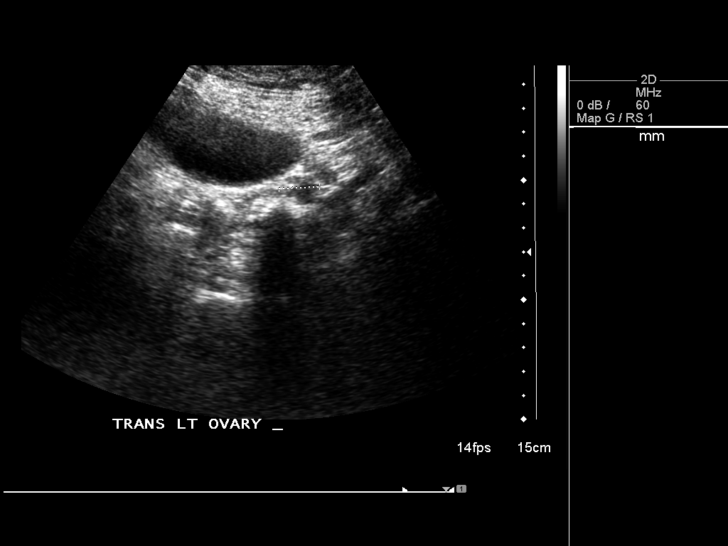
[im 28/109]
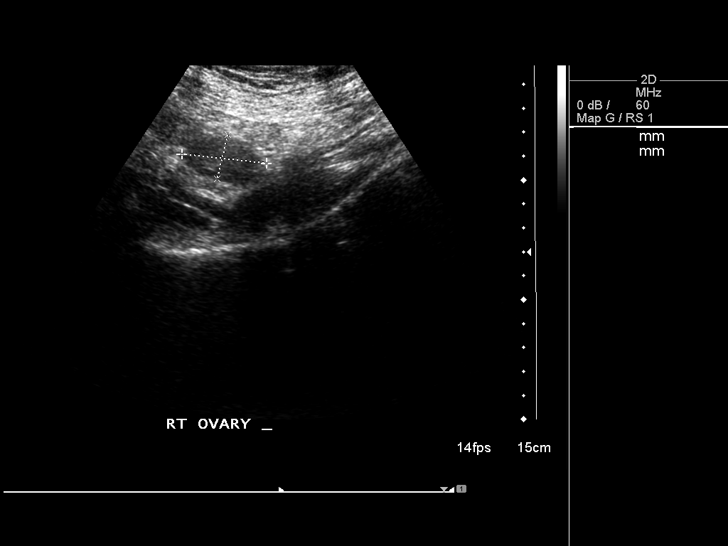
[im 37/109]
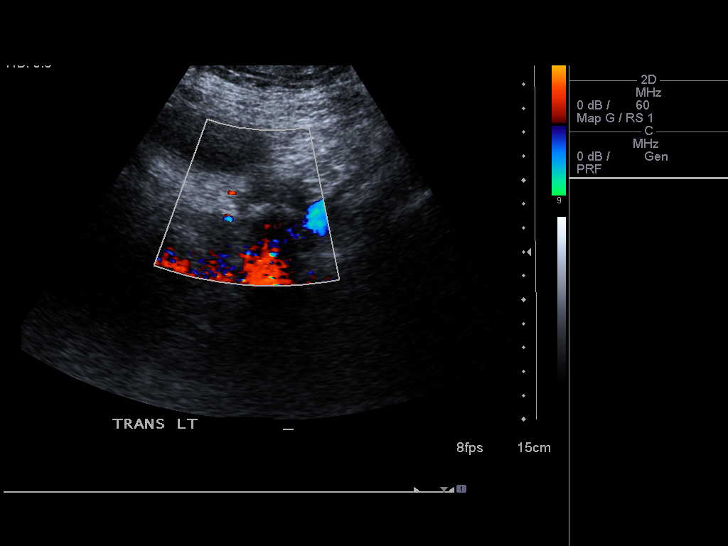
[im 46/109]
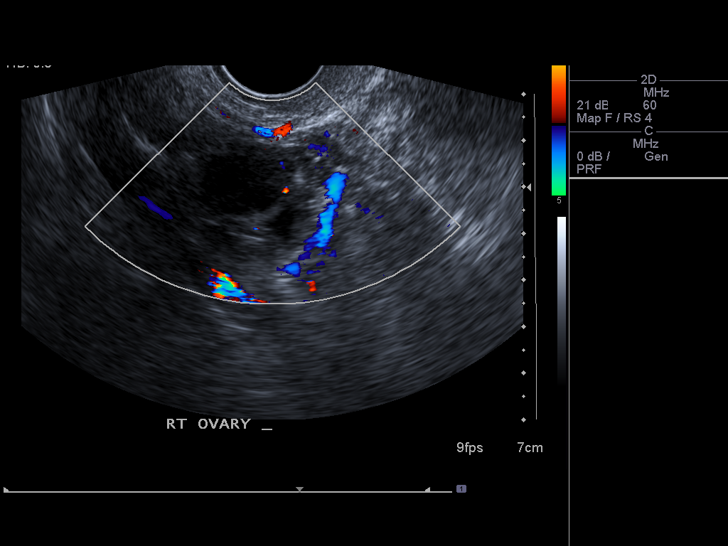
[im 55/109]
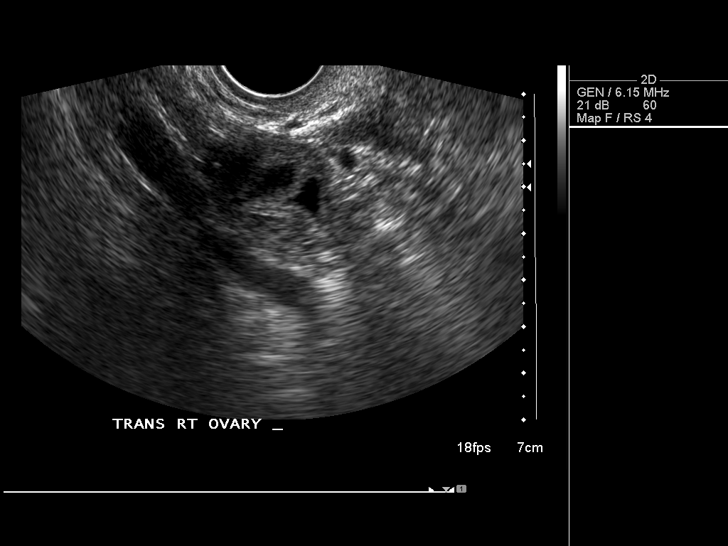
[im 64/109]
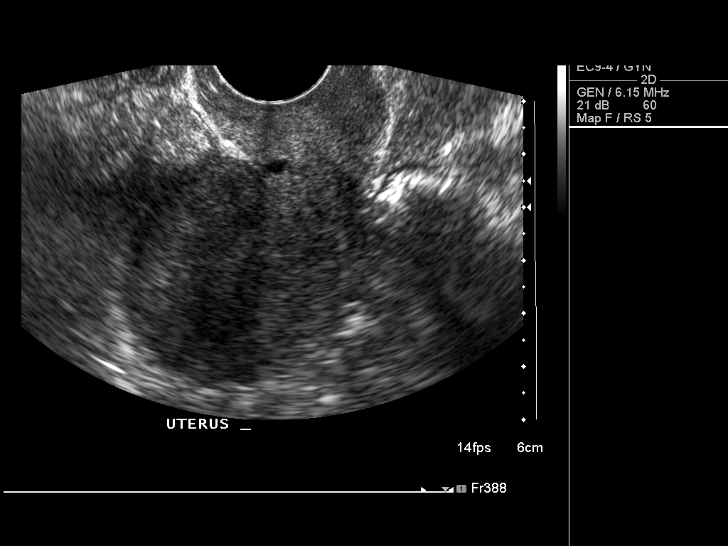
[im 73/109]
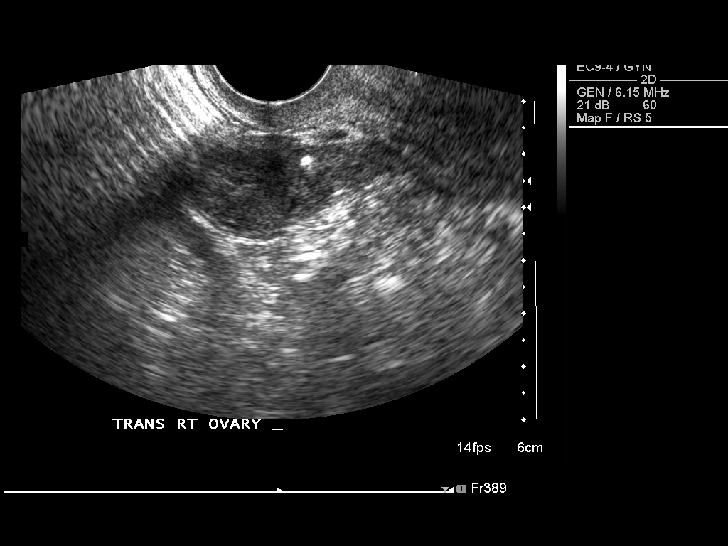
[im 82/109]
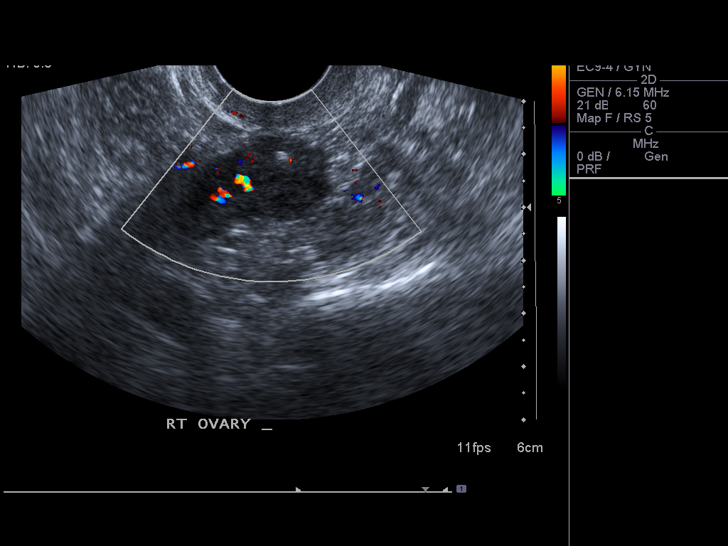
[im 91/109]
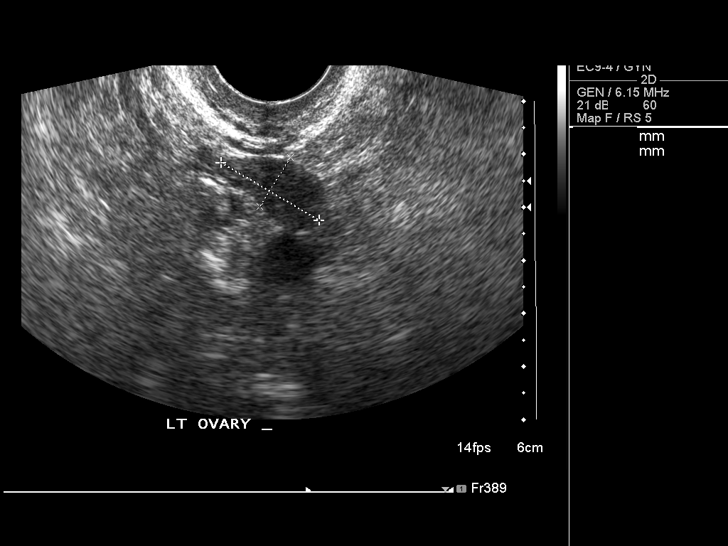
[im 100/109]
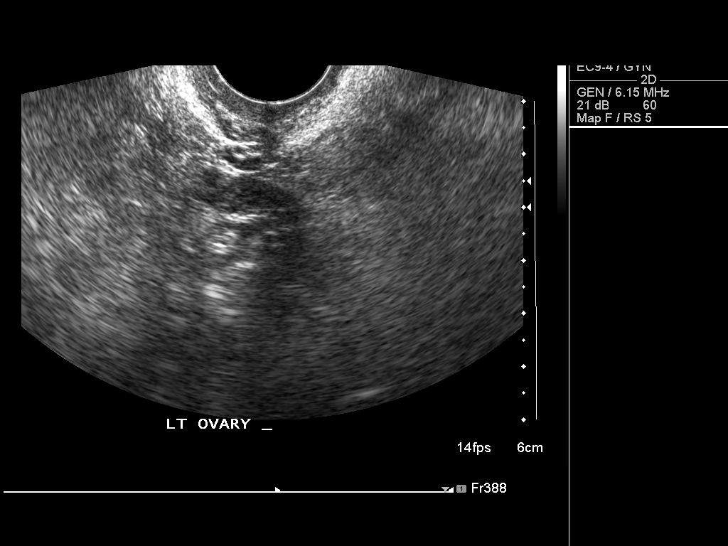
[im 109/109]
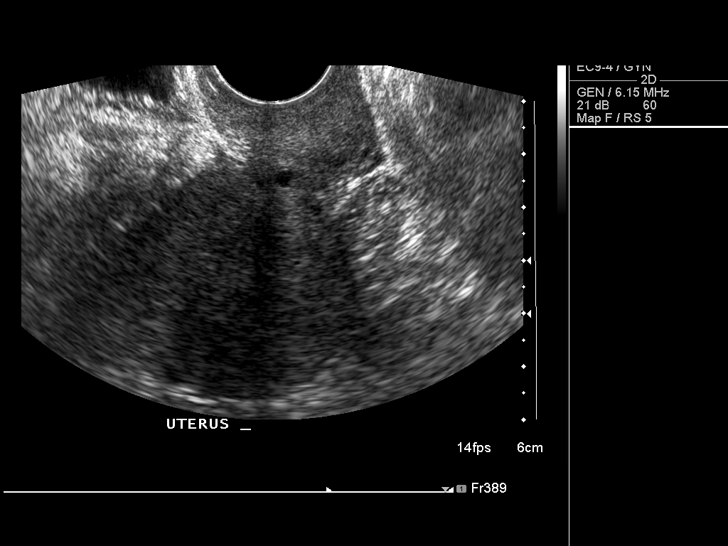

[13 of 25 positions shown; findings below may reference images not displayed]

FINDINGS: Uterus

Measurements: 5.0 x 4.1 x 5.0 cm. Normal morphology without mass

Endometrium

Thickness: 6 mm.  No endometrial fluid or focal abnormality

Right ovary

Measurements: 3.6 x 2.6 x 3.2 cm. Small complicated hemorrhagic
appearing cyst 1.8 x 1.7 x 1.5 cm. Small calcification within RIGHT
ovary.

Left ovary

Measurements: 2.1 x 1.1 x 1.5 cm. Normal morphology without mass.

Other findings

No free pelvic fluid.

No additional adnexal masses.
IMPRESSION: Small probable hemorrhagic cyst RIGHT ovary 1.8 cm greatest size.

Otherwise negative exam.

## 2017-03-21 ENCOUNTER — Other Ambulatory Visit: Payer: Self-pay | Admitting: Obstetrics

## 2017-03-21 DIAGNOSIS — B373 Candidiasis of vulva and vagina: Secondary | ICD-10-CM

## 2017-03-21 DIAGNOSIS — B3731 Acute candidiasis of vulva and vagina: Secondary | ICD-10-CM

## 2017-03-22 NOTE — Telephone Encounter (Signed)
Review for refill. 

## 2018-06-04 ENCOUNTER — Encounter (HOSPITAL_BASED_OUTPATIENT_CLINIC_OR_DEPARTMENT_OTHER): Payer: Self-pay

## 2018-06-04 ENCOUNTER — Other Ambulatory Visit: Payer: Self-pay | Admitting: Obstetrics

## 2018-06-04 ENCOUNTER — Encounter (HOSPITAL_BASED_OUTPATIENT_CLINIC_OR_DEPARTMENT_OTHER): Payer: 59

## 2018-06-04 DIAGNOSIS — Z1231 Encounter for screening mammogram for malignant neoplasm of breast: Secondary | ICD-10-CM

## 2022-02-09 ENCOUNTER — Other Ambulatory Visit (HOSPITAL_BASED_OUTPATIENT_CLINIC_OR_DEPARTMENT_OTHER): Payer: Self-pay

## 2022-02-10 ENCOUNTER — Other Ambulatory Visit (HOSPITAL_BASED_OUTPATIENT_CLINIC_OR_DEPARTMENT_OTHER): Payer: Self-pay

## 2022-02-10 MED ORDER — AMPHETAMINE-DEXTROAMPHET ER 25 MG PO CP24
ORAL_CAPSULE | ORAL | 0 refills | Status: AC
  Filled 2022-02-10: qty 10, 10d supply, fill #0
  Filled 2022-02-10: qty 20, 20d supply, fill #0

## 2022-06-12 ENCOUNTER — Other Ambulatory Visit: Payer: Self-pay

## 2022-06-12 ENCOUNTER — Emergency Department
Admission: EM | Admit: 2022-06-12 | Discharge: 2022-06-12 | Discharge status: Against Medical Advice | Payer: 59 | Attending: Emergency Medicine | Admitting: Emergency Medicine

## 2022-06-12 DIAGNOSIS — X58XXXA Exposure to other specified factors, initial encounter: Secondary | ICD-10-CM | POA: Insufficient documentation

## 2022-06-12 DIAGNOSIS — S65501A Unspecified injury of blood vessel of left index finger, initial encounter: Secondary | ICD-10-CM | POA: Diagnosis present

## 2022-06-12 DIAGNOSIS — S61211A Laceration without foreign body of left index finger without damage to nail, initial encounter: Secondary | ICD-10-CM | POA: Insufficient documentation

## 2022-06-12 DIAGNOSIS — Z5321 Procedure and treatment not carried out due to patient leaving prior to being seen by health care provider: Secondary | ICD-10-CM | POA: Diagnosis not present

## 2022-06-12 NOTE — ED Notes (Signed)
No answer when called to treatment room. Called pt on phone, stated left ED due to wait.

## 2022-06-12 NOTE — ED Triage Notes (Signed)
Pt presents via POV c/o laceration to left index finger.

## 2022-06-13 ENCOUNTER — Encounter: Payer: Self-pay | Admitting: Emergency Medicine

## 2022-06-13 ENCOUNTER — Ambulatory Visit
Admission: EM | Admit: 2022-06-13 | Discharge: 2022-06-13 | Disposition: A | Discharge status: Home / Self Care | Payer: 59

## 2022-06-13 DIAGNOSIS — S61311A Laceration without foreign body of left index finger with damage to nail, initial encounter: Secondary | ICD-10-CM

## 2022-06-13 DIAGNOSIS — B3731 Acute candidiasis of vulva and vagina: Secondary | ICD-10-CM

## 2022-06-13 DIAGNOSIS — Z23 Encounter for immunization: Secondary | ICD-10-CM

## 2022-06-13 MED ORDER — FLUCONAZOLE 150 MG PO TABS: ORAL_TABLET | ORAL | 0 refills | Status: AC

## 2022-06-13 MED ORDER — TETANUS-DIPHTH-ACELL PERTUSSIS 5-2.5-18.5 LF-MCG/0.5 IM SUSY: 0.5000 mL | PREFILLED_SYRINGE | Freq: Once | INTRAMUSCULAR | Status: DC

## 2022-06-13 MED ORDER — TETANUS-DIPHTH-ACELL PERTUSSIS 5-2.5-18.5 LF-MCG/0.5 IM SUSY
0.5000 mL | PREFILLED_SYRINGE | Freq: Once | INTRAMUSCULAR | Status: AC
  Administered 2022-06-13: 0.5 mL via INTRAMUSCULAR

## 2022-06-13 MED ORDER — CEPHALEXIN 500 MG PO CAPS: 500.0000 mg | ORAL_CAPSULE | Freq: Four times a day (QID) | ORAL | 0 refills | Status: AC

## 2022-06-13 NOTE — Discharge Instructions (Signed)
Keep your finger dry and clean for one week. After that you may get it wet, and the strips will fall out on their own Watch for signs of infection, like worse pain, redness and heat. If so you need to be seen again.

## 2022-06-13 NOTE — ED Triage Notes (Addendum)
Pt has laceration on her left index finger. States she cut her finger to the bone last night about 4:30-5:00 with a box cutter. Bleeding controlled. She went to the ED last night and was not seen. Las tetanus  06/08/10

## 2022-06-13 NOTE — ED Provider Notes (Signed)
[facility]    CSN: [id number] Arrival date & time: 06/13/22  1613      History   Chief Complaint Chief Complaint  Patient presents with   Laceration    Left index finger    HPI Hannah Davis is a 51 y.o. female presents with laceration on her L index finger which occurred last pm about 4:30 pm yesterday with a box cutter while at home. She went to ER and did not stay to be seen. 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/13/2014   BV (bacterial vaginosis) 12/09/2013   Bradycardia 11/04/2013   Candidiasis of vulva and vagina 12/03/2012   Metrorrhagia 11/16/2012   Elevated BP 06/05/2011   Hyperlipidemia 06/05/2011   Insomnia 06/05/2011   Preventative health care 06/05/2011   History of menorrhagia 06/05/2011   Chicken pox    S/P gastric surgery    Thyroid disorder    PFO (patent foramen ovale)    Hx of migraines     Past Surgical History:  Procedure Laterality Date   arthroscopy on right wrist  1997   BUNIONECTOMY  2005   left foot big toe   CESAREAN SECTION  1-00   ENDOMETRIAL ABLATION  11-11   LAPAROSCOPIC GASTRIC SLEEVE RESECTION  10/ 2013   TOTAL HIP ARTHROPLASTY Left 08/26/2014   Procedure: LEFT TOTAL HIP ARTHROPLASTY ANTERIOR APPROACH;  Surgeon: Shelda Pal, MD;  Location: [facility];  Service: Orthopedics;  Laterality: Left;   tubes tied  3-03    OB History   No obstetric history on file.       Home Medications    Prior to Admission medications   Medication Sig Start Date End Date Taking? Authorizing Provider  B Complex-C (B-COMPLEX WITH VITAMIN C) tablet Take 1 tablet by mouth daily.   Yes [provider]  BIOTIN PO Take 2 tablets by mouth daily.   Yes [provider]  cephALEXin (KEFLEX) 500 MG capsule Take 1 capsule (500 mg total) by mouth 4 (four) times daily for 5 days. 06/13/22 06/18/22 Yes Rodriguez-Southworth, Nettie Elm, PA-C  cholecalciferol (VITAMIN D) 1000 UNITS tablet Take 2,000 Units by mouth daily.   Yes [provider]  HYDROcodone-acetaminophen (NORCO) 10-325 MG tablet Take 1 tablet by mouth 4 (four) times daily as needed. 05/23/22  Yes [provider]  magnesium oxide (MAG-OX) 400 MG tablet Take 400 mg by mouth daily.   Yes [provider]  Omega 3 1000 MG CAPS Take 2,000 mg by mouth daily.   Yes [provider]  amphetamine-dextroamphetamine (ADDERALL XR) 25 MG 24 hr capsule Take 1 capsule by mouth once daily as needed. 12/02/21 02/21/22    amphetamine-dextroamphetamine (ADDERALL XR) 25 MG 24 hr capsule Take 1 capsule by mouth daily as needed.    [provider]  cyclobenzaprine (  FLEXERIL) 10 MG tablet Take 10 mg by mouth 3 (three) times daily as needed.    [provider]  fluconazole (DIFLUCAN) 150 MG tablet TAKE 1 TABLET BY MOUTH AS A SINGLE DOSE 06/13/22   Rodriguez-Southworth, Nettie Elm, PA-C  HYDROcodone-ibuprofen (VICOPROFEN) 7.5-200 MG tablet Take 1 tablet by mouth every 6 (six) hours as needed for moderate pain or severe pain. 06/01/16   Brock Bad, MD  lisinopril-hydrochlorothiazide (ZESTORETIC) 10-12.5 MG tablet Take 1 tablet by mouth daily.    [provider]  sertraline (ZOLOFT) 100 MG tablet Take 100 mg by mouth daily.    [provider]    Family History Family History  Problem Relation Age of Onset   Cancer Mother 40       kidney/ removed kidney in  remission   Fibromyalgia Mother    Asthma Mother    Stroke Mother    Heart attack Mother    Arthritis Mother        rheumatoid   Diabetes Father        type 2   Heart disease Father    Other Father        open heart bypass surgery/ smoked   Other Sister        kidney problems   Heart disease Brother    Other Brother        open heart surgery   Drug abuse Brother        cocaine   Other Maternal Grandmother        brain tumor   Heart attack Maternal Grandfather    Heart disease Maternal Grandfather        MI   Stroke Paternal Grandmother 49   Other Paternal Grandfather        enlarged heart   Obesity Paternal Grandfather    Stroke Brother        X 2    Social History Social History   Tobacco Use   Smoking status: Never   Smokeless tobacco: Never  Vaping Use   Vaping Use: Never used  Substance Use Topics   Alcohol use: Yes    Alcohol/week: 0.0 standard drinks of alcohol    Comment: a beer on the weekend   Drug use: No     Allergies   Codeine, Ibuprofen, and Latex   Review of Systems Review of Systems  Skin:  Positive for wound. Negative for color change.  Neurological:  Negative for weakness and numbness.     Physical Exam Triage Vital Signs ED Triage Vitals  Enc Vitals Group     BP 06/13/22 1659 (!) 137/97     Pulse Rate 06/13/22 1659 65     Resp 06/13/22 1659 16     Temp 06/13/22 1659 98.5 F (36.9 C)     Temp Source 06/13/22 1659 Oral     SpO2 06/13/22 1659 100 %     Weight 06/13/22 1656 190 lb (86.2 kg)     Height 06/13/22 1656 5\' 7"  (1.702 m)     Head Circumference --      Peak Flow --      Pain Score 06/13/22 1656 7     Pain Loc --      Pain Edu? --      Excl. in GC? --    No data found.  Updated Vital Signs BP (!) 137/97 (BP Location: Left Arm)   Pulse 65   Temp 98.5 F (36.9 C) (Oral)   Resp 16  Ht 5\' 7"  (1.702 m)   Wt 190 lb (86.2 kg)   LMP  (Approximate)   SpO2 100%   BMI 29.76 kg/m   Visual Acuity Right Eye  Distance:   Left Eye Distance:   Bilateral Distance:    Right Eye Near:   Left Eye Near:    Bilateral Near:     Physical Exam Vitals and nursing note reviewed.  Constitutional:      General: She is not in acute distress.    Appearance: She is not toxic-appearing.  Eyes:     Conjunctiva/sclera: Conjunctivae normal.  Pulmonary:     Effort: Pulmonary effort is normal.  Musculoskeletal:     Cervical back: Neck supple.  Skin:    General: Skin is warm and dry.     Comments: L index finger- has 2 cm laceration which is clean. Has some duck take residue around it since she had placed that last night. The cut is clean and seems to only involve the dermis. ROM of this finger is normal. There is no redness or warmth noted   Neurological:     Mental Status: She is alert and oriented to person, place, and time.     Gait: Gait normal.  Psychiatric:        Mood and Affect: Mood normal.        Behavior: Behavior normal.        Thought Content: Thought content normal.        Judgment: Judgment normal.     UC Treatments / Results  Labs (all labs ordered are listed, but only abnormal results are displayed) Labs Reviewed - No data to display  EKG   Radiology No results found.  Procedures Laceration Repair  Date/Time: 06/13/2022 6:30 PM  Performed by: 13/01/2022, PA-C Authorized by: Garey Ham, PA-C   Consent:    Consent obtained:  Verbal   Consent given by:  Patient   Risks, benefits, and alternatives were discussed: yes     Risks discussed:  Infection and pain Universal protocol:    Patient identity confirmed:  Verbally with patient Anesthesia:    Anesthesia method:  None Laceration details:    Location:  Finger   Finger location:  L index finger   Length (cm):  2   Depth (mm):  2 Exploration:    Imaging outcome: foreign body not noted     Wound exploration: entire depth of wound visualized     Wound extent: areolar tissue violated      Contaminated: no   Treatment:    Area cleansed with:  Saline and Shur-Clens   Amount of cleaning:  Standard   Irrigation solution:  Sterile saline   Visualized foreign bodies/material removed: no     Debridement:  None Skin repair:    Repair method:  Steri-Strips Approximation:    Approximation:  Close Repair type:    Repair type:  Simple Post-procedure details:    Dressing:  Sterile dressing and splint for protection   Procedure completion:  Tolerated well, no immediate complications  (including critical care time)  Medications Ordered in UC Medications  Tdap (BOOSTRIX) injection 0.5 mL (0.5 mLs Intramuscular Given 06/13/22 1703)    Initial Impression / Assessment and Plan / UC Course  I have reviewed the triage vital signs and the nursing notes.  Laceration L index finger  Due to delayed closure, I explained there are risk for infection, so I placed her on Keflex for 5 days as noted. I  sent Diflucan in case she gets an yeast infection from the keflex. Wound care reviewed. See instructions.     Final Clinical Impressions(s) / UC Diagnoses   Final diagnoses:  Laceration of left index finger without foreign body with damage to nail, initial encounter     Discharge Instructions      Keep your finger dry and clean for one week. After that you may get it wet, and the strips will fall out on their own Watch for signs of infection, like worse pain, redness and heat. If so you need to be seen again.      ED Prescriptions     Medication Sig Dispense Auth. Provider   cephALEXin (KEFLEX) 500 MG capsule Take 1 capsule (500 mg total) by mouth 4 (four) times daily for 5 days. 20 capsule Rodriguez-Southworth, Lillymae Duet, PA-C   fluconazole (DIFLUCAN) 150 MG tablet TAKE 1 TABLET BY MOUTH AS A SINGLE DOSE 1 tablet Rodriguez-Southworth, Sunday Spillers, PA-C      PDMP not reviewed this encounter.   Shelby Mattocks, [place] 06/13/22 1833

## 2022-06-20 LAB — COLOGUARD: COLOGUARD: NEGATIVE

## 2023-02-14 ENCOUNTER — Other Ambulatory Visit (HOSPITAL_BASED_OUTPATIENT_CLINIC_OR_DEPARTMENT_OTHER): Payer: Self-pay

## 2023-02-14 MED ORDER — WEGOVY 1 MG/0.5ML ~~LOC~~ SOAJ
1.0000 mg | SUBCUTANEOUS | 1 refills | Status: AC
  Filled 2023-02-14: qty 2, 28d supply, fill #0
  Filled 2023-03-10 – 2023-04-19 (×3): qty 2, 28d supply, fill #1
  Filled 2023-05-13: qty 2, 28d supply, fill #2
  Filled 2023-06-11: qty 2, 28d supply, fill #3
  Filled 2023-07-08: qty 2, 28d supply, fill #4
  Filled 2023-10-10: qty 2, 28d supply, fill #5

## 2023-02-15 ENCOUNTER — Other Ambulatory Visit (HOSPITAL_BASED_OUTPATIENT_CLINIC_OR_DEPARTMENT_OTHER): Payer: Self-pay

## 2023-03-13 ENCOUNTER — Other Ambulatory Visit (HOSPITAL_BASED_OUTPATIENT_CLINIC_OR_DEPARTMENT_OTHER): Payer: Self-pay

## 2023-03-13 ENCOUNTER — Encounter (HOSPITAL_BASED_OUTPATIENT_CLINIC_OR_DEPARTMENT_OTHER): Payer: Self-pay

## 2023-03-14 ENCOUNTER — Other Ambulatory Visit (HOSPITAL_BASED_OUTPATIENT_CLINIC_OR_DEPARTMENT_OTHER): Payer: Self-pay

## 2023-03-17 ENCOUNTER — Other Ambulatory Visit (HOSPITAL_BASED_OUTPATIENT_CLINIC_OR_DEPARTMENT_OTHER): Payer: Self-pay

## 2023-04-16 ENCOUNTER — Other Ambulatory Visit (HOSPITAL_BASED_OUTPATIENT_CLINIC_OR_DEPARTMENT_OTHER): Payer: Self-pay

## 2023-04-16 MED ORDER — WEGOVY 1 MG/0.5ML ~~LOC~~ SOAJ
1.0000 mg | SUBCUTANEOUS | 1 refills | Status: AC
  Filled 2023-04-16 – 2023-08-08 (×2): qty 2, 28d supply, fill #0
  Filled 2023-09-12: qty 2, 28d supply, fill #1

## 2023-04-17 ENCOUNTER — Other Ambulatory Visit (HOSPITAL_BASED_OUTPATIENT_CLINIC_OR_DEPARTMENT_OTHER): Payer: Self-pay

## 2023-04-19 ENCOUNTER — Other Ambulatory Visit (HOSPITAL_BASED_OUTPATIENT_CLINIC_OR_DEPARTMENT_OTHER): Payer: Self-pay

## 2023-04-20 ENCOUNTER — Other Ambulatory Visit (HOSPITAL_BASED_OUTPATIENT_CLINIC_OR_DEPARTMENT_OTHER): Payer: Self-pay

## 2023-06-12 ENCOUNTER — Other Ambulatory Visit: Payer: Self-pay

## 2023-06-15 ENCOUNTER — Other Ambulatory Visit (HOSPITAL_BASED_OUTPATIENT_CLINIC_OR_DEPARTMENT_OTHER): Payer: Self-pay

## 2023-08-08 ENCOUNTER — Other Ambulatory Visit (HOSPITAL_BASED_OUTPATIENT_CLINIC_OR_DEPARTMENT_OTHER): Payer: Self-pay

## 2023-08-12 ENCOUNTER — Other Ambulatory Visit (HOSPITAL_BASED_OUTPATIENT_CLINIC_OR_DEPARTMENT_OTHER): Payer: Self-pay

## 2023-08-12 MED ORDER — AMPHETAMINE-DEXTROAMPHET ER 25 MG PO CP24
25.0000 mg | ORAL_CAPSULE | Freq: Every day | ORAL | 0 refills | Status: AC
  Filled 2023-08-12 – 2023-08-22 (×3): qty 30, 30d supply, fill #0

## 2023-08-22 ENCOUNTER — Other Ambulatory Visit: Payer: Self-pay

## 2023-08-22 ENCOUNTER — Other Ambulatory Visit (HOSPITAL_BASED_OUTPATIENT_CLINIC_OR_DEPARTMENT_OTHER): Payer: Self-pay

## 2024-01-09 ENCOUNTER — Other Ambulatory Visit (HOSPITAL_BASED_OUTPATIENT_CLINIC_OR_DEPARTMENT_OTHER): Payer: Self-pay

## 2024-02-27 ENCOUNTER — Other Ambulatory Visit (HOSPITAL_BASED_OUTPATIENT_CLINIC_OR_DEPARTMENT_OTHER): Payer: Self-pay

## 2024-03-02 ENCOUNTER — Other Ambulatory Visit (HOSPITAL_BASED_OUTPATIENT_CLINIC_OR_DEPARTMENT_OTHER): Payer: Self-pay

## 2024-03-16 ENCOUNTER — Other Ambulatory Visit (HOSPITAL_BASED_OUTPATIENT_CLINIC_OR_DEPARTMENT_OTHER): Payer: Self-pay
